# Patient Record
Sex: Female | Born: 1965 | Race: White | Hispanic: No | Marital: Married | Smoking: Current every day smoker
Health system: Southern US, Community
[De-identification: ages and names within clinical notes are randomized; demographics above are authoritative.]

## PROBLEM LIST (undated history)

## (undated) DIAGNOSIS — E039 Hypothyroidism, unspecified: Secondary | ICD-10-CM

## (undated) DIAGNOSIS — R001 Bradycardia, unspecified: Secondary | ICD-10-CM

## (undated) DIAGNOSIS — H518 Other specified disorders of binocular movement: Secondary | ICD-10-CM

## (undated) DIAGNOSIS — D219 Benign neoplasm of connective and other soft tissue, unspecified: Secondary | ICD-10-CM

## (undated) DIAGNOSIS — Z7982 Long term (current) use of aspirin: Secondary | ICD-10-CM

## (undated) DIAGNOSIS — R002 Palpitations: Secondary | ICD-10-CM

## (undated) DIAGNOSIS — E232 Diabetes insipidus: Secondary | ICD-10-CM

## (undated) DIAGNOSIS — I700 Atherosclerosis of aorta: Secondary | ICD-10-CM

## (undated) DIAGNOSIS — I209 Angina pectoris, unspecified: Secondary | ICD-10-CM

## (undated) DIAGNOSIS — R0602 Shortness of breath: Secondary | ICD-10-CM

## (undated) DIAGNOSIS — M1990 Unspecified osteoarthritis, unspecified site: Secondary | ICD-10-CM

## (undated) DIAGNOSIS — F419 Anxiety disorder, unspecified: Secondary | ICD-10-CM

## (undated) DIAGNOSIS — D352 Benign neoplasm of pituitary gland: Secondary | ICD-10-CM

## (undated) DIAGNOSIS — R000 Tachycardia, unspecified: Secondary | ICD-10-CM

## (undated) DIAGNOSIS — R42 Dizziness and giddiness: Secondary | ICD-10-CM

## (undated) DIAGNOSIS — E785 Hyperlipidemia, unspecified: Secondary | ICD-10-CM

## (undated) DIAGNOSIS — H509 Unspecified strabismus: Secondary | ICD-10-CM

## (undated) DIAGNOSIS — I2510 Atherosclerotic heart disease of native coronary artery without angina pectoris: Secondary | ICD-10-CM

## (undated) DIAGNOSIS — E2749 Other adrenocortical insufficiency: Secondary | ICD-10-CM

## (undated) DIAGNOSIS — E237 Disorder of pituitary gland, unspecified: Secondary | ICD-10-CM

## (undated) HISTORY — PX: EYE SURGERY: SHX253

## (undated) HISTORY — PX: COLONOSCOPY: SHX174

## (undated) HISTORY — PX: PITUITARY EXCISION: SHX745

## (undated) HISTORY — PX: ANKLE FRACTURE SURGERY: SHX122

## (undated) HISTORY — DX: Anxiety disorder, unspecified: F41.9

---

## 2008-06-27 ENCOUNTER — Ambulatory Visit: Payer: Self-pay

## 2008-12-17 ENCOUNTER — Ambulatory Visit: Payer: Self-pay

## 2008-12-27 ENCOUNTER — Other Ambulatory Visit: Payer: Self-pay | Admitting: Physician Assistant

## 2009-01-03 ENCOUNTER — Ambulatory Visit: Payer: Self-pay

## 2009-01-14 ENCOUNTER — Ambulatory Visit: Payer: Self-pay | Admitting: Orthopedic Surgery

## 2009-10-09 ENCOUNTER — Ambulatory Visit: Payer: Self-pay | Admitting: Obstetrics & Gynecology

## 2009-10-15 ENCOUNTER — Ambulatory Visit: Payer: Self-pay | Admitting: Obstetrics & Gynecology

## 2010-01-05 HISTORY — PX: PARTIAL HYSTERECTOMY: SHX80

## 2012-01-29 ENCOUNTER — Inpatient Hospital Stay: Payer: Self-pay | Admitting: Orthopedic Surgery

## 2012-01-29 DIAGNOSIS — I10 Essential (primary) hypertension: Secondary | ICD-10-CM

## 2012-01-29 LAB — URINALYSIS, COMPLETE
Bacteria: NONE SEEN
Granular Cast: 3
Ketone: NEGATIVE
Leukocyte Esterase: NEGATIVE
Nitrite: NEGATIVE
RBC,UR: <1 /HPF (ref 0–5)
Specific Gravity: 1.016 (ref 1.003–1.030)
Squamous Epithelial: 1

## 2012-01-29 LAB — CBC WITH DIFFERENTIAL/PLATELET
Basophil %: 0.6 %
HCT: 39.5 % (ref 35.0–47.0)
HGB: 13.4 g/dL (ref 12.0–16.0)
Lymphocyte #: 2.7 10*3/uL (ref 1.0–3.6)
Lymphocyte %: 23.5 %
MCH: 30 pg (ref 26.0–34.0)
MCHC: 34 g/dL (ref 32.0–36.0)
MCV: 88 fL (ref 80–100)
Neutrophil %: 69.1 %
Platelet: 204 10*3/uL (ref 150–440)
RBC: 4.47 10*6/uL (ref 3.80–5.20)
RDW: 13.5 % (ref 11.5–14.5)
WBC: 11.6 10*3/uL — ABNORMAL HIGH (ref 3.6–11.0)

## 2012-01-29 LAB — BASIC METABOLIC PANEL
Anion Gap: 8 (ref 7–16)
BUN: 8 mg/dL (ref 7–18)
Chloride: 107 mmol/L (ref 98–107)
Co2: 21 mmol/L (ref 21–32)
EGFR (African American): >60
Glucose: 88 mg/dL (ref 65–99)
Osmolality: 270 (ref 275–301)

## 2012-01-29 LAB — PROTIME-INR
INR: 1
Prothrombin Time: 13.1 secs (ref 11.5–14.7)

## 2013-12-14 IMAGING — CR DG CHEST 2V
1 series · 2 of 2 positions shown · non-contrast
Comparison: none

REASON FOR EXAM: hypertension
COMMENTS:

PROCEDURE:     DXR - DXR CHEST PA (OR AP) AND LATERAL  - January 29, 2012 [DATE]
RESULT:     The lungs are clear. The cardiac silhouette and visualized bony
skeleton are unremarkable.

[Series 1: ap · 0.17mm/px · 2 of 2 slices shown]
[im 1/2]
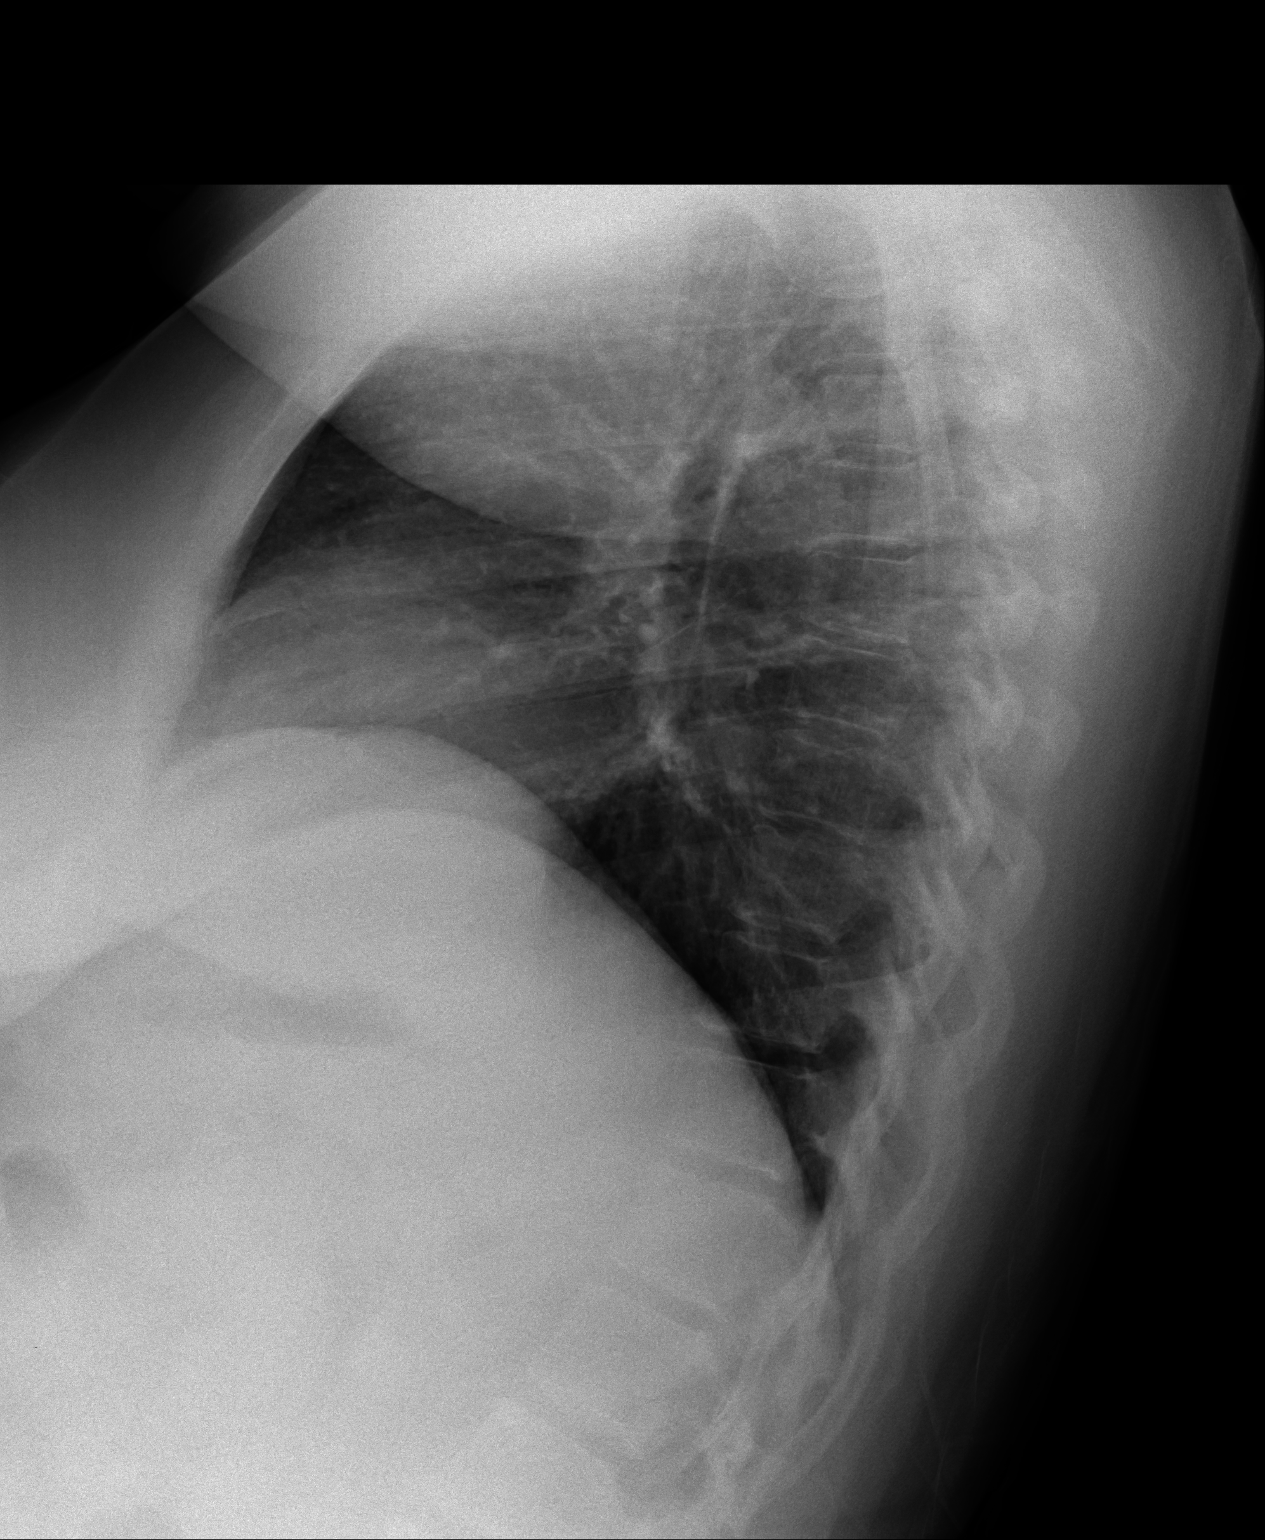
[im 2/2]
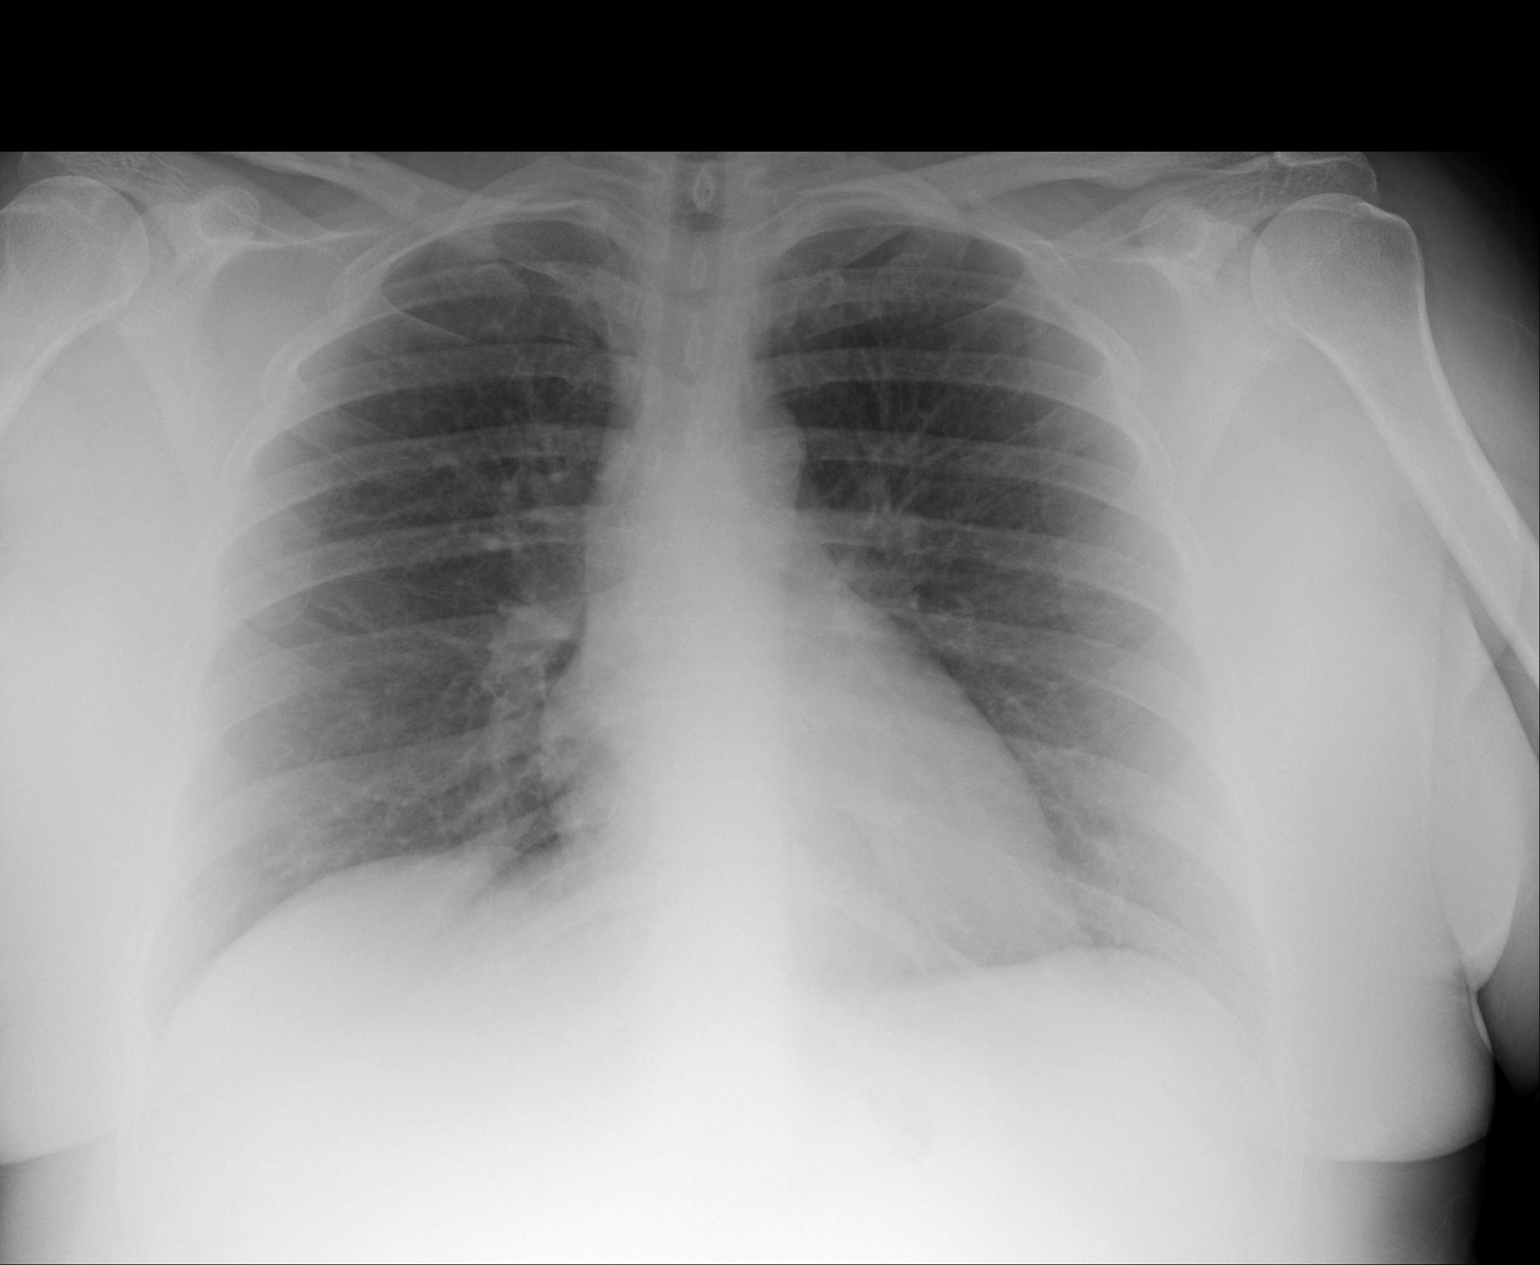

[2 of 2 positions shown; findings below may reference images not displayed]

IMPRESSION: 1. Chest radiograph without evidence of acute cardiopulmonary disease.

## 2013-12-14 IMAGING — CR RIGHT ANKLE - 2 VIEW
1 series · 2 of 2 positions shown · non-contrast
Comparison: none

REASON FOR EXAM: fall, fracture dislocation
COMMENTS:

PROCEDURE:     DXR - DXR ANKLE RIGHT AP AND LATERAL  - January 29, 2012  [DATE]
RESULT:
There has been interval reduction of the right ankle fracture. There is a
component of ankle mortise disruption medially. Overlying cast material
partially obscures evaluation.

[Series 1: ap · 0.17mm/px · 2 of 2 slices shown]
[im 1/2]
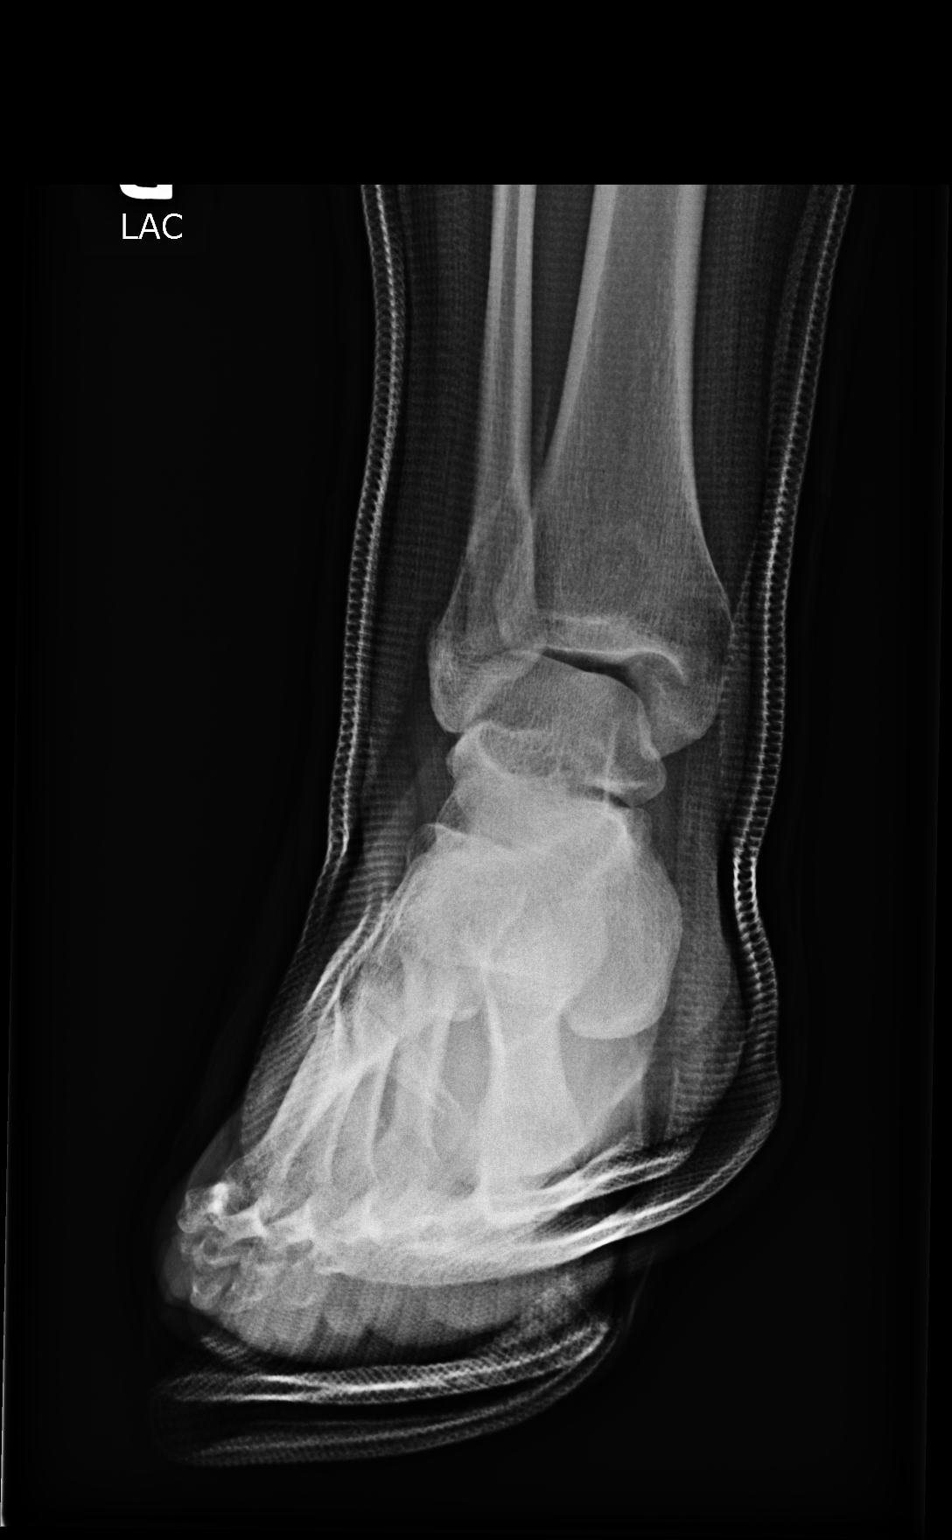
[im 2/2]
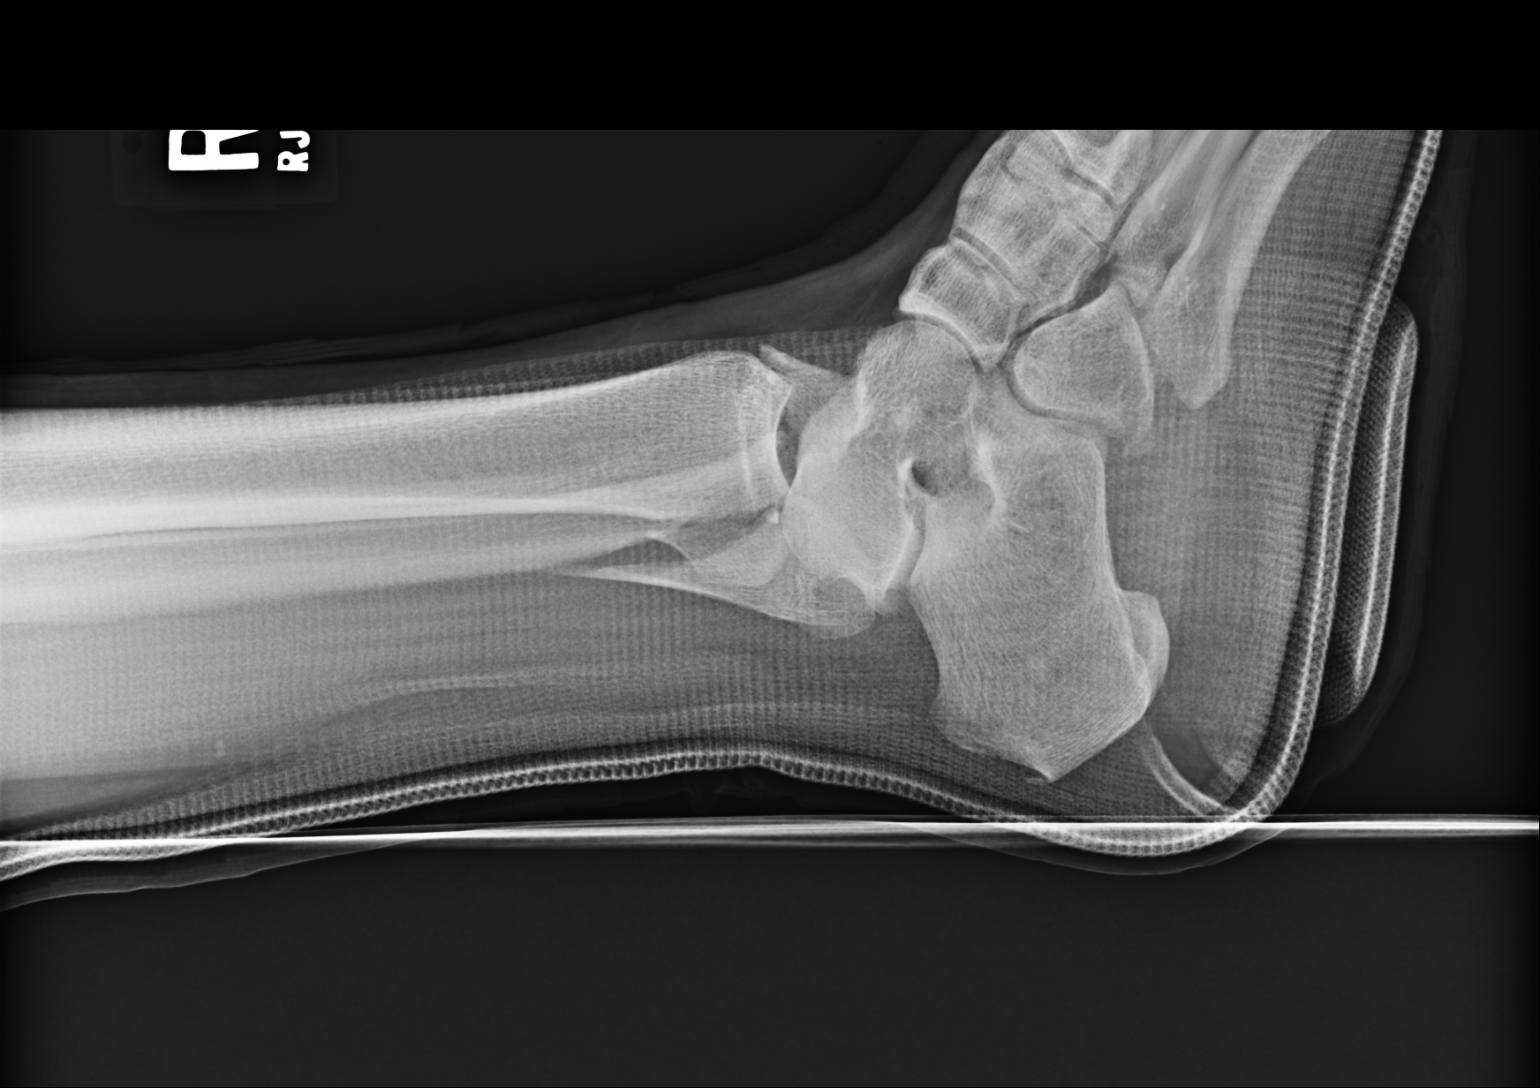

[2 of 2 positions shown; findings below may reference images not displayed]

IMPRESSION: Bimalleolar fracture involving the distal fibula and medial
malleolus.

## 2014-04-27 NOTE — H&P (Signed)
PATIENT NAME:  Frances Mckay, Frances Mckay MR#:  [id number] DATE OF BIRTH:  Aug 07, 1965  DATE OF ADMISSION:  01/29/2012  CHIEF COMPLAINT: Right ankle pain.   HISTORY OF PRESENT ILLNESS: The patient is a 49 year old who was stepping out of her home to go to work this morning when she slipped and rolled her ankle. She had immediate pain and deformity and was brought to the Emergency Room where she was splinted and x-rays obtained showing a trimalleolar ankle fracture-dislocation. She is being admitted for treatment of this. 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  PAST MEDICAL HISTORY: Hysterectomy.  CURRENT MEDICATIONS: Zoloft daily.  DRUG ALLERGIES: No known drug allergies.  SOCIAL HISTORY: She smokes about a third of a pack per day and works at [employer] in [employer].   REVIEW OF SYSTEMS: Positive only for right ankle pain. She denies any other injury.   PHYSICAL EXAMINATION:  GENERAL: Moderately obese white female who appears her stated age, in mild distress.  HEENT: Unremarkable.  LUNGS: Clear.  HEART: Regular rate and rhythm.  ABDOMEN: Soft and nontender.  EXTREMITIES: Remarkable for the right leg. She has intact sensation to her toes. She has a palpable dorsalis pedis pulse through the splint. She has brisk capillary refill and intact sensation.   X-RAYS: Trimalleolar fracture, posterior malleolar fragment that appears relatively small, large distal fibular spiral fracture and medial malleolus is also displaced.   CLINICAL IMPRESSION: Right trimalleolar ankle fracture-dislocation.   PLAN: I discussed treatment options as this is normally a fracture that requires repair. The risks, benefits and possible complications were discussed. We would like to proceed with surgery today before all the expected swelling occurs. She understands this and we will plan on surgery  later today since she has not eaten. Expect 1 to 2 nights in the hospital until she is safe with walker or crutches as well as giving her perioperative antibiotic. ____________________________ Laurene Footman, MD mjm:sb D: 01/29/2012 11:12:17 ET T: 01/29/2012 11:26:27 ET JOB#: [id number]  cc: Laurene Footman, MD, <Dictator> Laurene Footman MD ELECTRONICALLY SIGNED 01/29/2012 12:22

## 2014-04-27 NOTE — Op Note (Signed)
PATIENT NAME:  Frances Mckay, Frances Mckay MR#:  [id number] DATE OF BIRTH:  06/20/1965  DATE OF PROCEDURE:  01/29/2012  PREOPERATIVE DIAGNOSIS: Trimalleolar ankle fracture-dislocation, right ankle.   POSTOPERATIVE DIAGNOSIS: Trimalleolar ankle fracture-dislocation, right ankle.  PROCEDURE PERFORMED: Open reduction/internal fixation right trimalleolar ankle fracture-dislocation.   SURGEON: Laurene Footman, M.D.   ANESTHESIA: General.   COMPLICATIONS:  None.  SPECIMENS:  None.  ESTIMATED BLOOD LOSS:  Minimal.  IMPLANTS:  Biomet composite fibular plate, 6-hole, with multiple screws, two 35 mm malleolar screws medially, 4.0 malleolar screws.  DESCRIPTION OF PROCEDURE: The patient was brought to the Operating Room and after general anesthesia was obtained, the right leg was prepped and draped in the usual sterile fashion with a tourniquet applied to the upper thigh and a bump underneath the right buttock. After patient identification and timeout procedures were completed, the leg was exsanguinated with an Esmarch and the tourniquet raised to 300 mmHg.  A lateral incision was made, first to the midline of the fibula. Subcutaneous tissue was spread and the fracture site exposed. With a reduction clamp applied, anatomic reduction could be obtained. A six hole plate from the Biomet fibular plating system was obtained and contoured to fit the distal fibula.  In the six hole composite plate, the two proximal cortical screws were inserted and the locking screws were then filled sequentially with a K-wire to initially hold the plate in the appropriate position. The most distal screw hole was bent around so it would go up into the lateral malleolus and there was no penetration into the joint after all six screws had been placed.  The reduction clamp was removed and the fracture appeared quite stable. Lateral view showed adequate reduction of the posterior malleolar fragment with this indirect means.  Going to the medial  aspect of the ankle there abrasions anteromedially and so more of a posterior medial approach was made. The fracture site was exposed and of the fracture reduced. A K-wire was used to hold it in place.  A drill was then used to place a drill hole for a 35 mm malleolar screw.  The K-wire was removed and a second 35 mm screw was then inserted with good compression at the fracture site with essentially anatomic alignment on mortise view.  To palpation the fracture appeared adequately reduced, as well. The wounds were then thoroughly irrigated and the wounds closed with 2-0 Vicryl subcutaneously and skin staples. Xeroform, 4 x 4's, Webril, ABD and stirrup splint were applied followed by an Ace wrap. Tourniquet time: 45 minutes at 300 mmHg.   ____________________________ Laurene Footman, MD mjm:eg D: 01/29/2012 16:13:42 ET T: 01/30/2012 00:11:47 ET JOB#: [id number]  cc: Laurene Footman, MD, <Dictator> Laurene Footman MD ELECTRONICALLY SIGNED 01/30/2012 10:12

## 2014-04-27 NOTE — Discharge Summary (Signed)
PATIENT NAME:  Frances Mckay, Frances Mckay MR#:  [id number] DATE OF BIRTH:  1965-06-30  DATE OF ADMISSION:  01/29/2012 DATE OF DISCHARGE:    ADMITTING DIAGNOSIS: Right ankle trimalleolar fracture-dislocation.   DISCHARGE DIAGNOSIS: Right ankle trimalleolar fracture-dislocation.   SURGEON: Laurene Footman, M.D..   PROCEDURE PERFORMED: ORIF right trimalleolar ankle fracture-dislocation.   ANESTHESIA: General.   COMPLICATIONS: None.   SPECIMEN: None.   ESTIMATED BLOOD LOSS: Minimal.   IMPLANTS: Biomet composite fibular plate, 6-hole with multiple screws, two 35 mm malleolus screws medially and, 4.0 malleolar screws.   HISTORY: A 49 year old female, who stepping out of her home to go to work this morning when she slipped and rolled her ankle. She had immediate pain and deformity and was brought to the ER where she was splinted and x-rays obtained showing an ankle fracture-dislocation. She is being admitted for treatment of this. 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 No problems walking and had no loss of consciousness, just stumbled as she left her home.   PHYSICAL EXAMINATION:  GENERAL: A moderately obese white female, who appears her stated age in mild distress.  HEENT: Unremarkable.  LUNGS: Clear.  HEART: Regular rate and rhythm.  ABDOMEN: Soft, nontender.  EXTREMITIES: Remarkable for right leg. She has intact sensation to her toes. She has a palpable dorsalis pedis pulse through the splint. She has brisk capillary refill and intact sensation.   HOSPITAL COURSE: The patient was admitted to the hospital on 01/29/2012. She had surgery that same day and was brought to the orthopedic floor from the PACU in stable condition. On postoperative day 1, the patient had a moderate amount of pain. Her medications were changed to nucynta as well as methocarbamol. By postoperative 2, pain had significantly improved and she had progressed well enough in physical therapy to go home. On  postoperative day 2, dressing was changed. The incision looked well. A cast was placed. She was ready for discharge home in stable condition.   DISCHARGE INSTRUCTIONS: Elevate the affected foot and leg on 1 or 2 pillows with the foot higher than the knee. She is toe-touch weight-bearing. She may resume a regular diet as tolerated. She is to apply an ice pack to the affected area. Do not get the dressing or bandage wet or dirty. She needs to call [facility] ortho if the dressing gets water under it. Leave the dressing on. Do not place any objects under the cast and is to call [facility] ortho if any following occur: Bright red bleeding from the incision or wound or fever above 101.5 degrees, redness, swelling, or drainage at the incision. She needs to followup with [facility] orthopedics in 2 weeks.   DISCHARGE MEDICATIONS: Sertraline 100 mg oral tablet 1 tablet orally once a day in the morning and 0.5 mg tablet at bedtime, Nucynta 75 mg oral tablet 1 tablet every 4 to 6 hours as needed for pain, aspirin 325 delayed-release 1 tablet orally once a day, Bisacodyl 10 mg rectal suppository 1 suppository rectally once a day as needed for constipation, Senokot 1 tablet orally 2 times a day, methocarbamol 750 mg oral tablet 1 tablet 4 times a day as needed for muscle spasms.   ____________________________ Duanne Guess, PA-C tcg:aw D: 01/31/2012 12:00:14 ET T: 01/31/2012 12:43:09 ET JOB#: [id number]  cc: Duanne Guess, PA-C, <Dictator> Duanne Guess [place] ELECTRONICALLY SIGNED 02/09/2012 10:38

## 2014-09-18 ENCOUNTER — Other Ambulatory Visit: Payer: Self-pay | Admitting: Otolaryngology

## 2014-09-18 DIAGNOSIS — H905 Unspecified sensorineural hearing loss: Secondary | ICD-10-CM

## 2014-09-27 ENCOUNTER — Ambulatory Visit
Admission: RE | Admit: 2014-09-27 | Discharge: 2014-09-27 | Disposition: A | Discharge status: Home / Self Care | Payer: BC Managed Care – PPO | Source: Ambulatory Visit | Attending: Otolaryngology | Admitting: Otolaryngology

## 2014-09-27 DIAGNOSIS — H9042 Sensorineural hearing loss, unilateral, left ear, with unrestricted hearing on the contralateral side: Secondary | ICD-10-CM | POA: Diagnosis not present

## 2014-09-27 DIAGNOSIS — H905 Unspecified sensorineural hearing loss: Secondary | ICD-10-CM

## 2014-09-27 MED ORDER — GADOBENATE DIMEGLUMINE 529 MG/ML IV SOLN
15.0000 mL | Freq: Once | INTRAVENOUS | Status: AC | PRN
  Administered 2014-09-27: 15 mL via INTRAVENOUS

## 2014-10-08 DIAGNOSIS — E236 Other disorders of pituitary gland: Secondary | ICD-10-CM | POA: Insufficient documentation

## 2014-10-23 DIAGNOSIS — E221 Hyperprolactinemia: Secondary | ICD-10-CM | POA: Insufficient documentation

## 2014-12-06 DIAGNOSIS — E237 Disorder of pituitary gland, unspecified: Secondary | ICD-10-CM | POA: Insufficient documentation

## 2014-12-18 HISTORY — PX: OTHER SURGICAL HISTORY: SHX169

## 2015-01-09 DIAGNOSIS — E232 Diabetes insipidus: Secondary | ICD-10-CM | POA: Insufficient documentation

## 2015-01-09 DIAGNOSIS — E2749 Other adrenocortical insufficiency: Secondary | ICD-10-CM | POA: Insufficient documentation

## 2015-01-09 DIAGNOSIS — E038 Other specified hypothyroidism: Secondary | ICD-10-CM | POA: Insufficient documentation

## 2015-01-23 DIAGNOSIS — R519 Headache, unspecified: Secondary | ICD-10-CM | POA: Insufficient documentation

## 2015-01-23 DIAGNOSIS — R51 Headache: Secondary | ICD-10-CM

## 2015-01-30 DIAGNOSIS — D219 Benign neoplasm of connective and other soft tissue, unspecified: Secondary | ICD-10-CM | POA: Insufficient documentation

## 2015-03-20 LAB — HM PAP SMEAR: HM Pap smear: NEGATIVE

## 2015-03-20 LAB — HM MAMMOGRAPHY: HM Mammogram: NORMAL (ref 0–4)

## 2016-06-11 DIAGNOSIS — H52203 Unspecified astigmatism, bilateral: Secondary | ICD-10-CM

## 2016-06-11 DIAGNOSIS — H5213 Myopia, bilateral: Secondary | ICD-10-CM | POA: Insufficient documentation

## 2016-06-11 DIAGNOSIS — Z9889 Other specified postprocedural states: Secondary | ICD-10-CM | POA: Insufficient documentation

## 2016-06-11 DIAGNOSIS — H5005 Alternating esotropia: Secondary | ICD-10-CM | POA: Insufficient documentation

## 2016-06-11 DIAGNOSIS — H518 Other specified disorders of binocular movement: Secondary | ICD-10-CM | POA: Insufficient documentation

## 2016-09-22 ENCOUNTER — Encounter: Payer: Self-pay | Admitting: Obstetrics and Gynecology

## 2016-09-23 ENCOUNTER — Encounter: Payer: Self-pay | Admitting: Obstetrics and Gynecology

## 2016-09-23 ENCOUNTER — Ambulatory Visit (INDEPENDENT_AMBULATORY_CARE_PROVIDER_SITE_OTHER): Payer: BC Managed Care – PPO | Admitting: Obstetrics and Gynecology

## 2016-09-23 VITALS — BP 108/74 | HR 75 | Ht 64.0 in | Wt 207.0 lb

## 2016-09-23 DIAGNOSIS — Z1211 Encounter for screening for malignant neoplasm of colon: Secondary | ICD-10-CM | POA: Diagnosis not present

## 2016-09-23 DIAGNOSIS — Z1231 Encounter for screening mammogram for malignant neoplasm of breast: Secondary | ICD-10-CM

## 2016-09-23 DIAGNOSIS — F411 Generalized anxiety disorder: Secondary | ICD-10-CM

## 2016-09-23 DIAGNOSIS — Z01419 Encounter for gynecological examination (general) (routine) without abnormal findings: Secondary | ICD-10-CM

## 2016-09-23 DIAGNOSIS — Z1239 Encounter for other screening for malignant neoplasm of breast: Secondary | ICD-10-CM

## 2016-09-23 MED ORDER — ESCITALOPRAM OXALATE 10 MG PO TABS: 10.0000 mg | ORAL_TABLET | Freq: Every day | ORAL | 2 refills | Status: DC

## 2016-09-23 MED ORDER — HYDROXYZINE HCL 25 MG PO TABS: 25.0000 mg | ORAL_TABLET | Freq: Four times a day (QID) | ORAL | 2 refills | Status: DC | PRN

## 2016-09-23 NOTE — Progress Notes (Signed)
Gynecology Annual Exam  PCP: Sofie Hartigan, MD  Chief Complaint:  Chief Complaint  Patient presents with  . Gynecologic Exam    Anxiety/can not sleep    History of Present Illness: Patient is a 51 y.o. G1P1001 presents for annual exam. The patient has no complaints today.   LMP: No LMP recorded. Patient has had a hysterectomy. Supracervical by Dr. Kenton Kingfisher 10/15/2009  The patient is sexually active. She is not using anything for contraception secondary to prior hysterectomy. She denies dyspareunia.  The patient does perform self breast exams.  There is no notable family history of breast or ovarian cancer in her family.  The patient wears seatbelts: yes.   The patient has regular exercise: not asked.    The patient denies current symptoms of depression.    The patient is a 51 y.o. female presenting initial evaluation for symptoms of anxiety.  The patient is currently taking nothing for the management of her symptoms.  She has had any recent situational stressors, unsure status of her beach hourse, daughter graduating college and her daughters husband currently being deployed in [place].  She reports symptoms of insomnia, increased appetite and social anxiety.  She denies anhedonia, risk taking behavior, increased appetite, decreased appetite, agorophobia, feelings of guilt, feelings of worthlessness, suicidal ideation, homicidal ideation, auditory hallucinations and visual hallucinations. Symptoms have remained unchanged since initial onset.     The patient does have a pre-existing history of depression and anxiety.  She  does not a prior history of suicide attempts.  Previous treatment tied include was previously on paxil.  Review of Systems: Review of Systems  Constitutional: Negative for chills and fever.  HENT: Negative for congestion.   Respiratory: Negative for cough and shortness of breath.   Cardiovascular: Negative for chest pain and palpitations.  Gastrointestinal:  Negative for abdominal pain, constipation, diarrhea, heartburn, nausea and vomiting.  Genitourinary: Negative for dysuria, frequency and urgency.  Skin: Negative for itching and rash.  Neurological: Negative for dizziness and headaches.  Endo/Heme/Allergies: Negative for polydipsia.  Psychiatric/Behavioral: Negative for depression.    Past Medical History:  Past Medical History:  Diagnosis Date  . Anxiety     Past Surgical History:  Past Surgical History:  Procedure Laterality Date  . CESAREAN SECTION    . PARTIAL HYSTERECTOMY  2012   [facility]    Gynecologic History:  No LMP recorded. Patient has had a hysterectomy. Contraception: status post hysterectomy Last Pap: Results were: 03/20/15 NIL Last mammogram: 03/21/15 Results were: BI-RAD I  Obstetric History: G1P1001  Family History:  Family History  Problem Relation Age of Onset  . Hypertension Mother   . Diabetes Father   . Hypertension Father   . Hypertension Sister   . Diabetes Maternal Grandmother   . Hypertension Maternal Grandmother   . Diabetes Paternal Grandmother   . Hypertension Paternal Grandmother   . Lung cancer Paternal Grandfather   . Skin cancer Maternal Grandfather     Social History:  Social History   Social History  . Marital status: Married    Spouse name: N/A  . Number of children: N/A  . Years of education: N/A   Occupational History  . Not on file.   Social History Main Topics  . Smoking status: Never Smoker  . Smokeless tobacco: Never Used  . Alcohol use No  . Drug use: No  . Sexual activity: Yes    Partners: Male    Birth control/ protection: None   Other  Topics Concern  . Not on file   Social History Narrative  . No narrative on file    Allergies:  No Known Allergies  Medications: Prior to Admission medications   Not on File    Physical Exam Blood pressure 108/74, pulse 75, height 5\' 4"  (1.626 m), weight 207 lb (93.9 kg).  General: NAD HEENT: normocephalic,  anicteric Thyroid: no enlargement, no palpable nodules Pulmonary: No increased work of breathing, CTAB Cardiovascular: RRR, distal pulses 2+ Breast: Breast symmetrical, no tenderness, no palpable nodules or masses, no skin or nipple retraction present, no nipple discharge.  No axillary or supraclavicular lymphadenopathy. Abdomen: NABS, soft, non-tender, non-distended.  Umbilicus without lesions.  No hepatomegaly, splenomegaly or masses palpable. No evidence of hernia  Genitourinary:  External: Normal external female genitalia.  Normal urethral meatus, normal  Bartholin's and Skene's glands.    Vagina: Normal vaginal mucosa, no evidence of prolapse.    Cervix: Grossly normal in appearance, no bleeding  Uterus: Surgically absent  Adnexa: ovaries non-enlarged, no adnexal masses  Rectal: deferred  Lymphatic: no evidence of inguinal lymphadenopathy Extremities: no edema, erythema, or tenderness Neurologic: Grossly intact Psychiatric: mood appropriate, affect full  Female chaperone present for pelvic and breast  portions of the physical exam    Assessment: 51 y.o. G1P1001 routine annual exam  Plan: Problem List Items Addressed This Visit    None    Visit Diagnoses    Breast screening       Relevant Orders   MM DIGITAL SCREENING BILATERAL   Special screening for malignant neoplasms, colon       Relevant Orders   Ambulatory referral to Gastroenterology   Encounter for gynecological examination without abnormal finding         Annual   1) Mammogram - recommend yearly screening mammogram.  Mammogram Was ordered today   2) STI screening was not offered  3) ASCCP guidelines and rational discussed.  Patient opts for every 3 years screening interval - next 2020  4) Contraception - N/A  5) Colonoscopy -- Screening recommended starting at age 84 for average risk individuals, age 69 for individuals deemed at increased risk (including African Americans) and recommended to continue  until age 33.  For patient age 89-85 individualized approach is recommended.  Gold standard screening is via colonoscopy, Cologuard screening is an acceptable alternative for patient unwilling or unable to undergo colonoscopy.  "Colorectal cancer screening for average?risk adults: 2018 guideline update from the American Cancer Society"CA: A Cancer Journal for Clinicians: Jun 03, 2016  - colonoscopy referral made   6) Routine healthcare maintenance including cholesterol, diabetes screening discussed managed by PCP   7) Folloq up 1 year for routine annual exam  Generalized Anxiety 1) GAD-7 today is 18, denies significant depression symptoms.  Has had some stressors but feels situational stressors aren't the cause and symptoms have been present previously  No SI/HI. - Start lexapro 10mg  tab po daily - Vistaril prn  - If otherwise good improvement in symptoms but continued insomnia consider adding trazodone at next visit  - Follow up and rescale in 6 weeks

## 2016-10-26 ENCOUNTER — Telehealth: Payer: Self-pay | Admitting: Gastroenterology

## 2016-10-26 NOTE — Telephone Encounter (Signed)
Patient is returning a call to schedule a procedure

## 2016-10-27 DIAGNOSIS — D352 Benign neoplasm of pituitary gland: Secondary | ICD-10-CM | POA: Insufficient documentation

## 2016-10-30 ENCOUNTER — Ambulatory Visit (INDEPENDENT_AMBULATORY_CARE_PROVIDER_SITE_OTHER): Payer: BC Managed Care – PPO | Admitting: Obstetrics and Gynecology

## 2016-10-30 ENCOUNTER — Encounter: Payer: Self-pay | Admitting: Obstetrics and Gynecology

## 2016-10-30 VITALS — BP 130/82 | HR 73 | Ht 64.0 in | Wt 207.0 lb

## 2016-10-30 DIAGNOSIS — F411 Generalized anxiety disorder: Secondary | ICD-10-CM | POA: Diagnosis not present

## 2016-10-30 MED ORDER — TRAZODONE HCL 50 MG PO TABS: 50.0000 mg | ORAL_TABLET | Freq: Every evening | ORAL | 6 refills | Status: DC | PRN

## 2016-10-30 MED ORDER — ESCITALOPRAM OXALATE 10 MG PO TABS: 10.0000 mg | ORAL_TABLET | Freq: Every day | ORAL | 11 refills | Status: DC

## 2016-10-30 MED ORDER — HYDROXYZINE HCL 25 MG PO TABS: 25.0000 mg | ORAL_TABLET | Freq: Four times a day (QID) | ORAL | 2 refills | Status: DC | PRN

## 2016-10-30 NOTE — Progress Notes (Signed)
Obstetrics & Gynecology Office Visit   Chief Complaint:  Chief Complaint  Patient presents with  . Follow-up    Medication    History of Present Illness: The patient is a 51 y.o. female presenting follow up for symptoms of anxiety.  The patient is currently taking Lexapro 10mg  tab po daily and prn vistaril for the management of her symptoms.  She has not had any recent situational stressors.  She reports symptoms of still having some problems falling asleep but no problems staying asleep.  She denies anhedonia, day time somnolence, insomnia, risk taking behavior, irritability, increased appetite, decreased appetite, social anxiety, agorophobia, feelings of guilt, feelings of worthlessness, suicidal ideation, homicidal ideation, auditory hallucinations and visual hallucinations. Symptoms have improved since last visit.     The patient does have a pre-existing history of depression and anxiety.  She  does not a prior history of suicide attempts.    Review of Systems:Review of Systems  Neurological: Negative for dizziness and headaches.  Psychiatric/Behavioral: Negative for depression, hallucinations, memory loss, substance abuse and suicidal ideas. The patient has insomnia. The patient is not nervous/anxious.     Past Medical History:  Past Medical History:  Diagnosis Date  . Anxiety     Past Surgical History:  Past Surgical History:  Procedure Laterality Date  . CESAREAN SECTION    . PARTIAL HYSTERECTOMY  2012   [facility]    Gynecologic History: No LMP recorded. Patient has had a hysterectomy.  Obstetric History: G1P1001  Family History:  Family History  Problem Relation Age of Onset  . Hypertension Mother   . Diabetes Father   . Hypertension Father   . Hypertension Sister   . Diabetes Maternal Grandmother   . Hypertension Maternal Grandmother   . Diabetes Paternal Grandmother   . Hypertension Paternal Grandmother   . Lung cancer Paternal Grandfather   . Skin  cancer Maternal Grandfather     Social History:  Social History   Social History  . Marital status: Married    Spouse name: N/A  . Number of children: N/A  . Years of education: N/A   Occupational History  . Not on file.   Social History Main Topics  . Smoking status: Never Smoker  . Smokeless tobacco: Never Used  . Alcohol use No  . Drug use: No  . Sexual activity: Yes    Partners: Male    Birth control/ protection: None   Other Topics Concern  . Not on file   Social History Narrative  . No narrative on file    Allergies:  No Known Allergies  Medications: Prior to Admission medications   Medication Sig Start Date End Date Taking? Authorizing Provider  Acetaminophen (MAPAP) 500 MG coapsule Take by mouth.   Yes [provider]  escitalopram (LEXAPRO) 10 MG tablet Take 1 tablet (10 mg total) by mouth daily. 10/30/16 10/30/17 Yes Malachy Mood, MD  hydrOXYzine (ATARAX/VISTARIL) 25 MG tablet Take 1 tablet (25 mg total) by mouth every 6 (six) hours as needed for itching. 10/30/16  Yes Malachy Mood, MD  Multiple Vitamin (MULTI-VITAMINS) TABS Take by mouth.   Yes [provider]  traZODone (DESYREL) 50 MG tablet Take 1 tablet (50 mg total) by mouth at bedtime as needed for sleep. 10/30/16   Malachy Mood, MD    Physical Exam Vitals:  Vitals:   10/30/16 0826  BP: 130/82  Pulse: 73   No LMP recorded. Patient has had a hysterectomy.  General: NAD  HEENT: normocephalic, anicteric Pulmonary: No increased work of breathing Neurologic: Grossly intact Psychiatric: mood appropriate, affect full, good insight  Female chaperone present for pelvic and breast  portions of the physical exam  Assessment: 51 y.o. G1P1001 follow up for generalized anxiety  Plan: Problem List Items Addressed This Visit    None    Visit Diagnoses    Generalized anxiety disorder    -  Primary   Relevant Medications   hydrOXYzine (ATARAX/VISTARIL) 25 MG tablet     traZODone (DESYREL) 50 MG tablet   escitalopram (LEXAPRO) 10 MG tablet     - GAD-7 7 down from 18, with significant improvement in symptoms PHQ-9 4  - Add trazodone prn for sleep - Keep lexapro at 10mg  daily patient is happy with the current control of symptoms.  No side-effects since starting.  We discussed if worsening or relapse of symptoms we do have the ability to increase the dose of lexparo as well as adding buspar. - A total of 15 minutes were spent in face-to-face contact with the patient during this encounter with over half of that time devoted to counseling and coordination of care.

## 2016-10-30 NOTE — Telephone Encounter (Signed)
LVM for pt to return my call.

## 2016-11-04 ENCOUNTER — Telehealth: Payer: Self-pay

## 2016-11-04 NOTE — Telephone Encounter (Signed)
Returned call to schedule colonoscopy.  She said she discussed with her PCP and will wait until she turns 52 to have colonoscopy.  Thanks [employer]

## 2016-12-04 ENCOUNTER — Ambulatory Visit
Admission: EM | Admit: 2016-12-04 | Discharge: 2016-12-04 | Disposition: A | Discharge status: Home / Self Care | Payer: BC Managed Care – PPO | Attending: Family Medicine | Admitting: Family Medicine

## 2016-12-04 DIAGNOSIS — M25551 Pain in right hip: Secondary | ICD-10-CM | POA: Diagnosis not present

## 2016-12-04 DIAGNOSIS — S139XXA Sprain of joints and ligaments of unspecified parts of neck, initial encounter: Secondary | ICD-10-CM

## 2016-12-04 DIAGNOSIS — M5440 Lumbago with sciatica, unspecified side: Secondary | ICD-10-CM | POA: Diagnosis not present

## 2016-12-04 MED ORDER — NAPROXEN 500 MG PO TABS: 500.0000 mg | ORAL_TABLET | Freq: Two times a day (BID) | ORAL | 0 refills | Status: DC

## 2016-12-04 NOTE — ED Provider Notes (Signed)
[facility]    CSN: [id number] Arrival date & time: 12/04/16  0847     History   Chief Complaint Chief Complaint  Patient presents with  . Hip Pain    HPI Frances Mckay is a 51 y.o. female.   HPI   This is a 51 year old female who presents with pain in her neck and lower back and posterior hip area she states that on Wednesday 2 days prior to this visit she had a partial slip when she on wet floor in her kitchen.  She did not completely fall but after the slip noticed that she had her neck stiff neck low back pain and the right hip pain.  She has had the right hip pain for some time this was exacerbated when she slipped.  She has no radicular component in her lower or upper extremities.  She states that it has been somewhat better with rest.  She is taking ibuprofen 600 mg twice a day which seems to help somewhat.        Past Medical History:  Diagnosis Date  . Anxiety     Patient Active Problem List   Diagnosis Date Noted  . Pituitary microadenoma ([place]) 10/27/2016  . 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 History of strabismus surgery 06/11/2016  . Myopia of both eyes with astigmatism 06/11/2016  . Benign neoplasm of connective and other soft tissue, unspecified 01/30/2015  . Granular cell tumor 01/30/2015  . Headache 01/23/2015  . 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 Hypothyroidism, secondary 01/09/2015  . 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 Other specified hypothyroidism 01/09/2015  . Secondary adrenal insufficiency ([place]) 01/09/2015  . Pituitary lesion ([place]) 12/06/2014  . Hyperprolactinemia ([place]) 10/23/2014  . Mass of pituitary [facility]) 10/08/2014    Past Surgical History:  Procedure Laterality Date  . CESAREAN SECTION    . PARTIAL HYSTERECTOMY  2012   [facility]    OB History    Gravida Para Term Preterm AB Living   1 1 1     1    SAB TAB Ectopic Multiple Live Births         1       Home Medications    Prior to Admission medications   Medication Sig Start Date End Date Taking? Authorizing Provider  Acetaminophen (MAPAP) 500 MG coapsule Take by mouth.   Yes [provider]  escitalopram (LEXAPRO) 10 MG tablet Take 1 tablet (10 mg total) by mouth daily. 10/30/16 10/30/17 Yes Malachy Mood, MD  hydrOXYzine (ATARAX/VISTARIL) 25 MG tablet Take 1 tablet (25 mg total) by mouth every 6 (six) hours as needed for itching. 10/30/16  Yes Malachy Mood, MD  Multiple Vitamin (MULTI-VITAMINS) TABS Take by mouth.   Yes [provider]  naproxen (NAPROSYN) 500 MG tablet Take 1 tablet (500 mg total) by mouth 2 (two) times daily with a meal. 12/04/16   Lorin Picket, PA-C    Family History Family History  Problem Relation Age of Onset  . Hypertension Mother   . Diabetes Father   . Hypertension Father   . Hypertension Sister   . Diabetes Maternal Grandmother   . Hypertension Maternal Grandmother   . Diabetes Paternal Grandmother   . Hypertension Paternal Grandmother   . Lung cancer Paternal Grandfather   . Skin cancer Maternal Grandfather     Social History Social History   Tobacco Use  . Smoking status: Never Smoker  . Smokeless tobacco: Never  Used  Substance Use Topics  . Alcohol use: No  . Drug use: No     Allergies   Patient has no known allergies.   Review of Systems Review of Systems  Constitutional: Positive for activity change. Negative for chills, fatigue and fever.  Musculoskeletal: Positive for back pain, gait problem and neck pain.  All other systems reviewed and are negative.    Physical Exam Triage Vital Signs ED Triage Vitals  Enc Vitals Group     BP 12/04/16 0916 128/71     Pulse Rate 12/04/16 0916 63     Resp 12/04/16 0916 16     Temp 12/04/16 0916 98 F (36.7 C)     Temp Source 12/04/16 0916 Oral     SpO2 12/04/16 0916 96 %     Weight 12/04/16 0915 206 lb (93.4 kg)     Height 12/04/16  0915 5\' 4"  (1.626 m)     Head Circumference --      Peak Flow --      Pain Score 12/04/16 0913 7     Pain Loc --      Pain Edu? --      Excl. in [place]? --    No data found.  Updated Vital Signs BP 128/71 (BP Location: Left Arm)   Pulse 63   Temp 98 F (36.7 C) (Oral)   Resp 16   Ht 5\' 4"  (1.626 m)   Wt 206 lb (93.4 kg)   SpO2 96%   BMI 35.36 kg/m   Visual Acuity Right Eye Distance:   Left Eye Distance:   Bilateral Distance:    Right Eye Near:   Left Eye Near:    Bilateral Near:     Physical Exam  Constitutional: She is oriented to person, place, and time. She appears well-developed and well-nourished. No distress.  HENT:  Head: Normocephalic.  Eyes: Pupils are equal, round, and reactive to light.  Neck:  Of the neck shows fairly good range of motion discomfort only with rightward rotation causing her to have left-sided paracervical muscle pain.  Upper examination is normal.  Musculoskeletal:  Examination of the lumbar spine was performed with Nevin Bloodgood, CMA as assistant/ chaperone.  It has a level pelvis in stance.  She is able to forward flex return to upright posture and lateral flex bilaterally normally.  Lateral flexion to the left causes right sided buttock and posterior hip pain.  Able to toe and heel walk adequately.  EHL peroneal and anterior tibialis muscles are strong to clinical testing.  Patient is intact throughout the lower extremity.  DTRs are 2+/4 bilaterally symmetrical.  Leg raise testing is -90 degrees on the left positive at between 80 and 90 degrees on the right with low back pain and posterior hip pain.  Internal rotation of the hip reproduces her pain in the buttock.  Neurological: She is alert and oriented to person, place, and time.  Skin: Skin is warm and dry. She is not diaphoretic.  Psychiatric: She has a normal mood and affect. Her behavior is normal. Judgment and thought content normal.  Nursing note and vitals reviewed.    UC Treatments / Results   Labs (all labs ordered are listed, but only abnormal results are displayed) Labs Reviewed - No data to display  EKG  EKG Interpretation None       Radiology No results found.  Procedures Procedures (including critical care time)  Medications Ordered in UC Medications - No data to display  Initial Impression / Assessment and Plan / UC Course  I have reviewed the triage vital signs and the nursing notes.  Pertinent labs & imaging results that were available during my care of the patient were reviewed by me and considered in my medical decision making (see chart for details).     Plan: 1. Test/x-ray results and diagnosis reviewed with patient 2. rx as per orders; risks, benefits, potential side effects reviewed with patient 3. Recommend supportive treatment with test and symptom avoidance.  Stop ibuprofen at the present time and switch her to Naprosyn 500 twice daily with food.  Would recommend her following up with her primary care physician for further evaluation and possible imaging of the right hip. 4. F/u prn if symptoms worsen or don't improve   Final Clinical Impressions(s) / UC Diagnoses   Final diagnoses:  Acute right-sided low back pain with sciatica, sciatica laterality unspecified  Cervical sprain, initial encounter  Right hip pain    ED Discharge Orders        Ordered    naproxen (NAPROSYN) 500 MG tablet  2 times daily with meals     12/04/16 1107       Controlled Substance Prescriptions [place] Controlled Substance Registry consulted? Not Applicable   Lorin Picket, PA-C 12/04/16 1118

## 2016-12-04 NOTE — ED Triage Notes (Addendum)
As per patient hip pain and neck pain, lower back pain onset yesteraday she slipped and tripped and now has hip pain work requires note to go back.

## 2016-12-18 ENCOUNTER — Encounter: Payer: Self-pay | Admitting: *Deleted

## 2017-04-02 ENCOUNTER — Other Ambulatory Visit: Payer: Self-pay | Admitting: Obstetrics and Gynecology

## 2017-04-02 MED ORDER — HYDROXYZINE HCL 25 MG PO TABS
25.0000 mg | ORAL_TABLET | Freq: Four times a day (QID) | ORAL | 2 refills | Status: DC | PRN
Start: 2017-04-02 — End: 2023-11-05

## 2017-06-28 ENCOUNTER — Telehealth: Payer: Self-pay

## 2017-06-28 NOTE — Telephone Encounter (Signed)
Pharmacy requesting a refill on Trazodone 50mg  (substituted for Desyrel)

## 2017-06-29 ENCOUNTER — Other Ambulatory Visit: Payer: Self-pay | Admitting: Obstetrics and Gynecology

## 2017-06-29 MED ORDER — TRAZODONE HCL 50 MG PO TABS: 50.0000 mg | ORAL_TABLET | Freq: Every evening | ORAL | 6 refills | Status: DC | PRN

## 2018-03-04 ENCOUNTER — Telehealth: Payer: Self-pay

## 2018-03-04 NOTE — Telephone Encounter (Signed)
Needs follow up hasn't been seen in 1.5 years

## 2018-03-04 NOTE — Telephone Encounter (Signed)
[facility] faxed over a refill request for Trazodone 50mg .   Please advise

## 2018-03-07 NOTE — Telephone Encounter (Signed)
Unable to reach patient. I called pharmacy to deny refill.

## 2018-12-14 ENCOUNTER — Other Ambulatory Visit: Payer: Self-pay | Admitting: Family Medicine

## 2018-12-14 DIAGNOSIS — Z1231 Encounter for screening mammogram for malignant neoplasm of breast: Secondary | ICD-10-CM

## 2018-12-16 ENCOUNTER — Ambulatory Visit
Admission: RE | Admit: 2018-12-16 | Discharge: 2018-12-16 | Disposition: A | Discharge status: Home / Self Care | Payer: BC Managed Care – PPO | Source: Ambulatory Visit | Attending: Family Medicine | Admitting: Family Medicine

## 2018-12-16 ENCOUNTER — Other Ambulatory Visit: Payer: Self-pay

## 2018-12-16 DIAGNOSIS — Z1231 Encounter for screening mammogram for malignant neoplasm of breast: Secondary | ICD-10-CM | POA: Insufficient documentation

## 2018-12-19 ENCOUNTER — Other Ambulatory Visit: Payer: Self-pay | Admitting: *Deleted

## 2018-12-19 ENCOUNTER — Inpatient Hospital Stay
Admission: RE | Admit: 2018-12-19 | Discharge: 2018-12-19 | Disposition: A | Discharge status: Home / Self Care | Payer: Self-pay | Source: Ambulatory Visit | Attending: *Deleted | Admitting: *Deleted

## 2018-12-19 DIAGNOSIS — Z1231 Encounter for screening mammogram for malignant neoplasm of breast: Secondary | ICD-10-CM

## 2020-01-15 ENCOUNTER — Other Ambulatory Visit: Payer: Self-pay | Admitting: Family Medicine

## 2020-01-15 DIAGNOSIS — Z1231 Encounter for screening mammogram for malignant neoplasm of breast: Secondary | ICD-10-CM

## 2020-06-18 ENCOUNTER — Ambulatory Visit
Admission: RE | Admit: 2020-06-18 | Discharge: 2020-06-18 | Disposition: A | Discharge status: Home / Self Care | Payer: BC Managed Care – PPO | Source: Ambulatory Visit | Attending: Family Medicine | Admitting: Family Medicine

## 2020-06-18 ENCOUNTER — Other Ambulatory Visit: Payer: Self-pay

## 2020-06-18 DIAGNOSIS — Z1231 Encounter for screening mammogram for malignant neoplasm of breast: Secondary | ICD-10-CM

## 2020-09-30 ENCOUNTER — Ambulatory Visit
Admission: RE | Admit: 2020-09-30 | Discharge: 2020-09-30 | Disposition: A | Discharge status: Home / Self Care | Payer: BC Managed Care – PPO | Source: Ambulatory Visit | Attending: Internal Medicine | Admitting: Internal Medicine

## 2020-09-30 ENCOUNTER — Other Ambulatory Visit: Payer: Self-pay

## 2020-09-30 VITALS — BP 156/98 | HR 78 | Temp 98.7°F | Resp 18 | Ht 64.0 in | Wt 187.0 lb

## 2020-09-30 DIAGNOSIS — R21 Rash and other nonspecific skin eruption: Secondary | ICD-10-CM | POA: Diagnosis not present

## 2020-09-30 MED ORDER — PREDNISONE 20 MG PO TABS: 20.0000 mg | ORAL_TABLET | Freq: Every day | ORAL | 0 refills | Status: AC

## 2020-09-30 NOTE — ED Triage Notes (Signed)
Pt here with C/O rash/bumps on hands, and tongue. Painful, was exposed to granddaughter who was DX with HFM

## 2020-09-30 NOTE — Discharge Instructions (Addendum)
Please quarantine until monkeypox results are available Please take medications as prescribed Please use only mild or hypoallergenic soap when washing your hands Avoid harsh chemicals on the hands Return to urgent care if symptoms worsen or the rash spreads beyond current areas involved We will call you with recommendations if labs are abnormal.

## 2020-09-30 NOTE — ED Provider Notes (Signed)
[facility]    CSN: [id number] Arrival date & time: 09/30/20  0834      History   Chief Complaint Chief Complaint  Patient presents with   Rash    Appointment 9am    HPI Frances Mckay is a 55 y.o. female comes to the urgent care with 3-4-day history of rash in the palm of both hands as well as the trunk.  Patient was recently exposed to a grandchild who had hand-foot-and-mouth disease.  Patient says the rash started and it seems to be spreading in the palm of both hands.  The rash is painful on occasion but it is also associated with burning sensation in the hands.  She has tried Benadryl cream with no improvement in her symptoms.  She denies any changes in cosmetics or soaps.  She works in Copy at school.  She says the soap was changed on Friday and that coincides with when her symptoms started.  No fever or chills.  No swelling of fingers.   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   Other specified hypothyroidism 01/09/2015   Secondary adrenal insufficiency ([place]) 01/09/2015   Pituitary lesion ([place]) 12/06/2014   Hyperprolactinemia ([place]) 10/23/2014   Mass of pituitary ([place]) 10/08/2014    Past Surgical History:  Procedure Laterality Date   CESAREAN SECTION     PARTIAL HYSTERECTOMY  2012   [facility]    OB History     Gravida  1   Para  1   Term  1   Preterm      AB      Living  1       SAB      IAB      Ectopic      Multiple      Live Births  1            Home Medications    Prior to Admission medications   Medication Sig Start Date End Date Taking? Authorizing Provider  Acetaminophen 500 MG capsule Take by mouth.   Yes [provider]  diazepam (VALIUM) 5 MG tablet SMARTSIG:1 Tablet(s) By Mouth Every 12 Hours PRN 08/08/20  Yes [provider]  meloxicam (MOBIC) 15 MG tablet Take 15 mg by mouth daily. 08/29/20  Yes [provider]  Multiple Vitamin (MULTI-VITAMINS) TABS Take by mouth.   Yes [provider]  PARoxetine (PAXIL) 20 MG tablet Take 20 mg by mouth daily. 08/29/20  Yes [provider]  predniSONE (DELTASONE) 20 MG tablet Take 1 tablet (20 mg total) by mouth daily for 5 days. 09/30/20 10/05/20 Yes Barbi Kumagai, Myrene Galas, MD  rosuvastatin (CRESTOR) 5 MG tablet Take 1 tablet by mouth daily. 04/12/20  Yes [provider]  escitalopram (LEXAPRO) 10 MG tablet Take 1 tablet (10 mg total) by mouth daily. 10/30/16 10/30/17  Malachy Mood, MD  hydrOXYzine (ATARAX/VISTARIL) 25 MG tablet Take 1 tablet (25 mg total) by mouth every 6 (  six) hours as needed for itching. 04/02/17   Malachy Mood, MD  naproxen (NAPROSYN) 500 MG tablet Take 1 tablet (500 mg total) by mouth 2 (two) times daily with a meal. 12/04/16   Lorin Picket, PA-C  traZODone (DESYREL) 50 MG tablet Take 1 tablet (50 mg total) by mouth at bedtime as needed for sleep. 06/29/17   Malachy Mood, MD    Family History Family History  Problem Relation Age of Onset   Hypertension Mother    Diabetes Father    Hypertension Father    Hypertension Sister    Diabetes Maternal Grandmother    Hypertension Maternal Grandmother    Diabetes Paternal Grandmother    Hypertension Paternal Grandmother    Lung cancer Paternal Grandfather    Skin cancer Maternal Grandfather    Breast cancer Neg Hx     Social History Social History   Tobacco Use    Smoking status: Never   Smokeless tobacco: Never  Vaping Use   Vaping Use: Never used  Substance Use Topics   Alcohol use: No   Drug use: No     Allergies   Patient has no known allergies.   Review of Systems Review of Systems  Constitutional: Negative.   HENT: Negative.    Gastrointestinal: Negative.   Musculoskeletal: Negative.   Skin:  Positive for color change and rash. Negative for wound.    Physical Exam Triage Vital Signs ED Triage Vitals  Enc Vitals Group     BP 09/30/20 0907 (!) 156/98     Pulse Rate 09/30/20 0907 78     Resp 09/30/20 0907 18     Temp 09/30/20 0907 98.7 F (37.1 C)     Temp Source 09/30/20 0907 Oral     SpO2 09/30/20 0907 100 %     Weight 09/30/20 0905 187 lb (84.8 kg)     Height 09/30/20 0905 5\' 4"  (1.626 m)     Head Circumference --      Peak Flow --      Pain Score 09/30/20 0905 8     Pain Loc --      Pain Edu? --      Excl. in [place]? --    No data found.  Updated Vital Signs BP (!) 156/98 (BP Location: Right Arm)   Pulse 78   Temp 98.7 F (37.1 C) (Oral)   Resp 18   Ht 5\' 4"  (1.626 m)   Wt 84.8 kg   SpO2 100%   BMI 32.10 kg/m   Visual Acuity Right Eye Distance:   Left Eye Distance:   Bilateral Distance:    Right Eye Near:   Left Eye Near:    Bilateral Near:     Physical Exam Vitals and nursing note reviewed.  Constitutional:      General: She is not in acute distress.    Appearance: She is not ill-appearing.  Musculoskeletal:        General: No swelling or tenderness. Normal range of motion.  Skin:    General: Skin is warm.     Comments: Multiple pustular vesicular rashes involving the palm of both hands.  Rash is well-circumscribed with erythematous ring around it.  The rash ranges from 60mm to 5 mm in the largest diameter.  Some of the rashes contain clear fluid was at this have purulent looking fluid.  Neurological:     Mental Status: She is alert.     UC Treatments / Results  Labs (all labs  ordered are  listed, but only abnormal results are displayed) Labs Reviewed  MONKEYPOX VIRUS DNA, QUALITATIVE REAL-TIME PCR  MONKEYPOX VIRUS DNA, QUALITATIVE REAL-TIME PCR    EKG   Radiology No results found.  Procedures Procedures (including critical care time)  Medications Ordered in UC Medications - No data to display  Initial Impression / Assessment and Plan / UC Course  I have reviewed the triage vital signs and the nursing notes.  Pertinent labs & imaging results that were available during my care of the patient were reviewed by me and considered in my medical decision making (see chart for details).     1.  Palmar rash: Monkeypox PCR test has been sent I suspect this is contact dermatitis.  A short course of steroids will be given. Patient is advised to quarantine until results are available Return precautions given. Final Clinical Impressions(s) / UC Diagnoses   Final diagnoses:  Rash of both hands     Discharge Instructions      Please quarantine until monkeypox results are available Please take medications as prescribed Please use only mild or hypoallergenic soap when washing your hands Avoid harsh chemicals on the hands Return to urgent care if symptoms worsen or the rash spreads beyond current areas involved We will call you with recommendations if labs are abnormal.   ED Prescriptions     Medication Sig Dispense Auth. Provider   predniSONE (DELTASONE) 20 MG tablet Take 1 tablet (20 mg total) by mouth daily for 5 days. 5 tablet Donette Mainwaring, Myrene Galas, MD      PDMP not reviewed this encounter.   Chase Picket, MD 09/30/20 [phone]

## 2020-10-01 LAB — MONKEYPOX VIRUS DNA, QUALITATIVE REAL-TIME PCR
Orthopoxvirus DNA, QL PCR: NOT DETECTED
Orthopoxvirus DNA, QL PCR: NOT DETECTED

## 2021-05-21 ENCOUNTER — Other Ambulatory Visit: Payer: Self-pay | Admitting: Family Medicine

## 2021-05-21 DIAGNOSIS — Z1231 Encounter for screening mammogram for malignant neoplasm of breast: Secondary | ICD-10-CM

## 2021-06-19 ENCOUNTER — Ambulatory Visit
Admission: RE | Admit: 2021-06-19 | Discharge: 2021-06-19 | Disposition: A | Discharge status: Home / Self Care | Payer: BC Managed Care – PPO | Source: Ambulatory Visit | Attending: Family Medicine | Admitting: Family Medicine

## 2021-06-19 DIAGNOSIS — Z1231 Encounter for screening mammogram for malignant neoplasm of breast: Secondary | ICD-10-CM | POA: Insufficient documentation

## 2022-05-04 IMAGING — MG MM DIGITAL SCREENING BILAT W/ TOMO AND CAD
8 series · 8 of 24 positions shown · non-contrast
Comparison: Previous exam(s).

ACR Breast Density Category a: The breast tissue is almost entirely
fatty.

CLINICAL DATA: Screening.

EXAM:
DIGITAL SCREENING BILATERAL MAMMOGRAM WITH TOMOSYNTHESIS AND CAD
TECHNIQUE: Bilateral screening digital craniocaudal and mediolateral oblique
mammograms were obtained. Bilateral screening digital breast
tomosynthesis was performed. The images were evaluated with
computer-aided detection.

[R CC synth-2D]
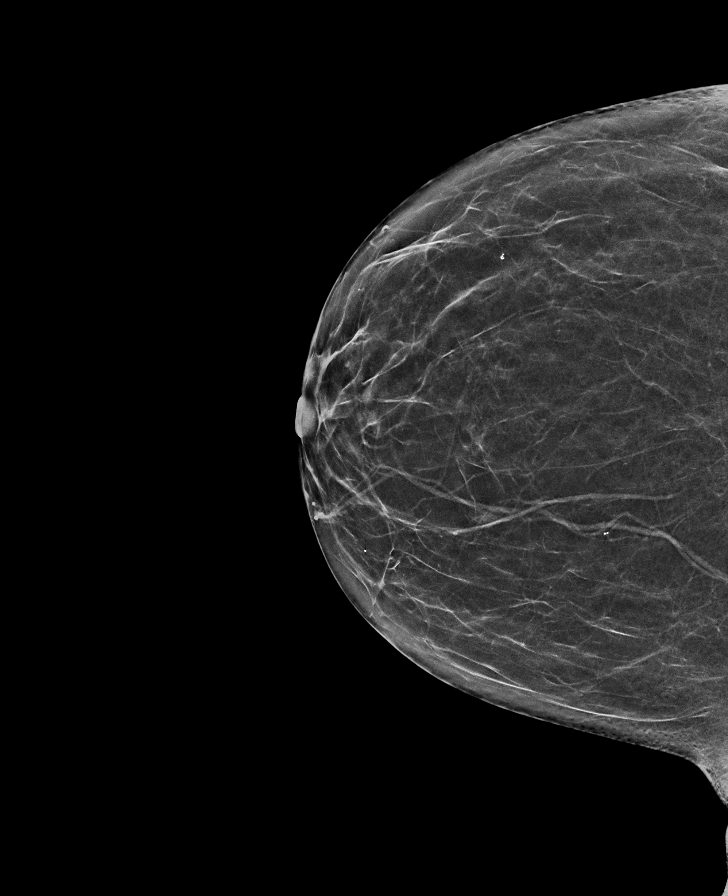

[R MLO synth-2D]
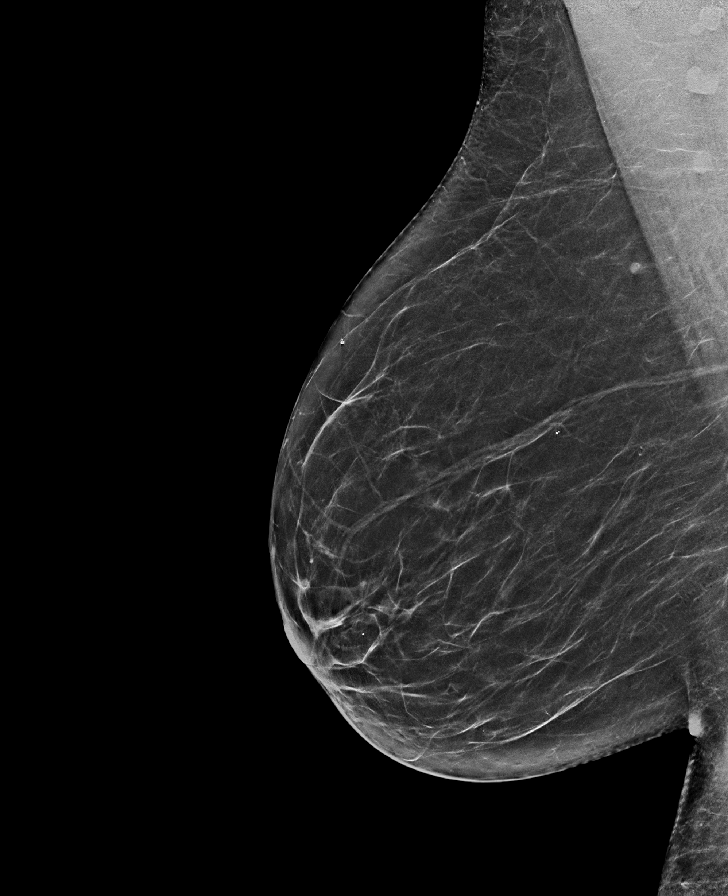

[L CC synth-2D]
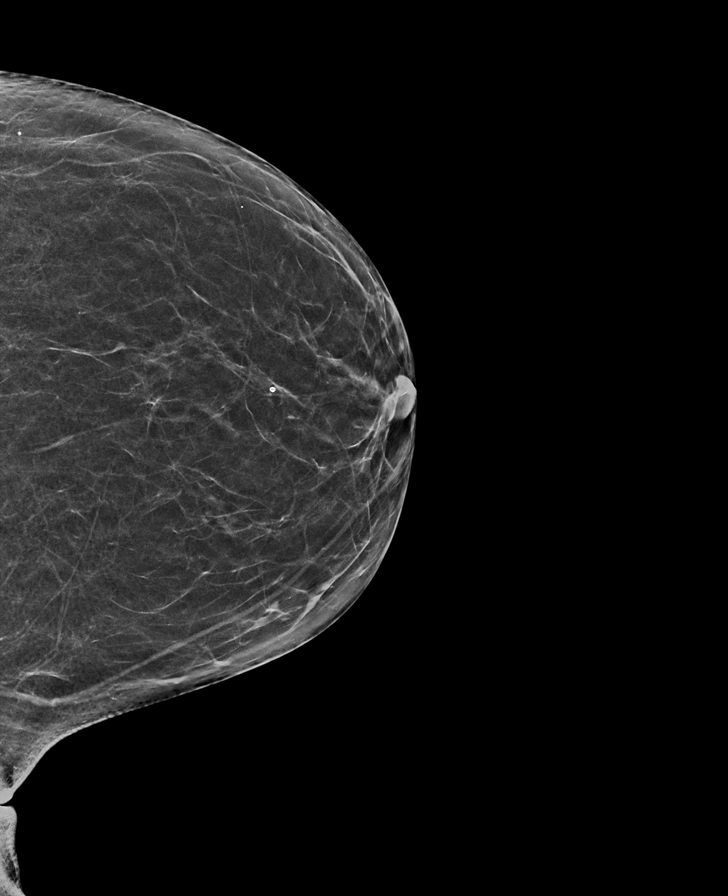

[L MLO synth-2D]
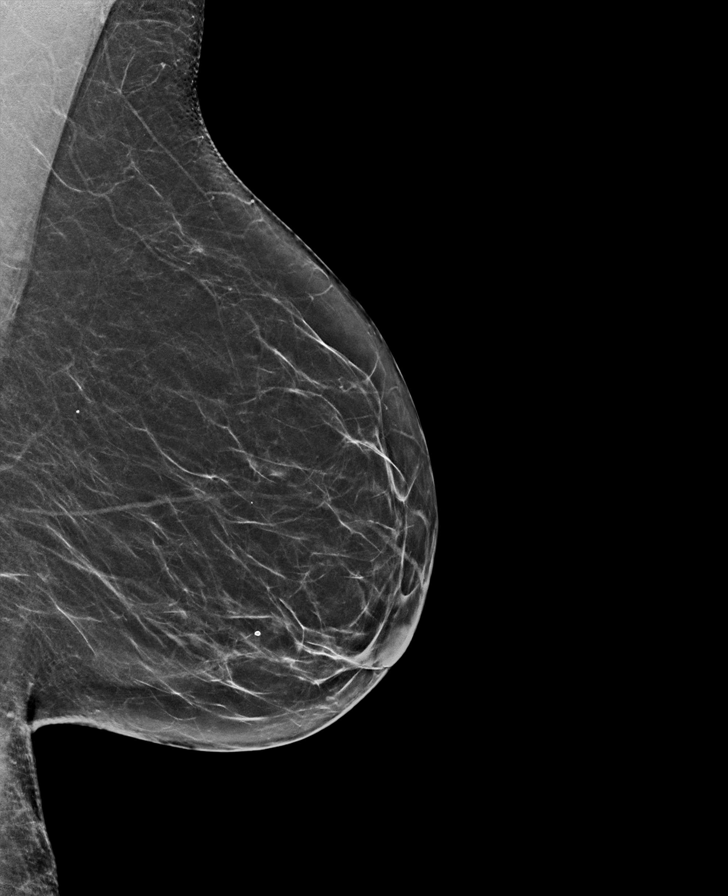

[L CC tomo · tomo slice 28/55.0]
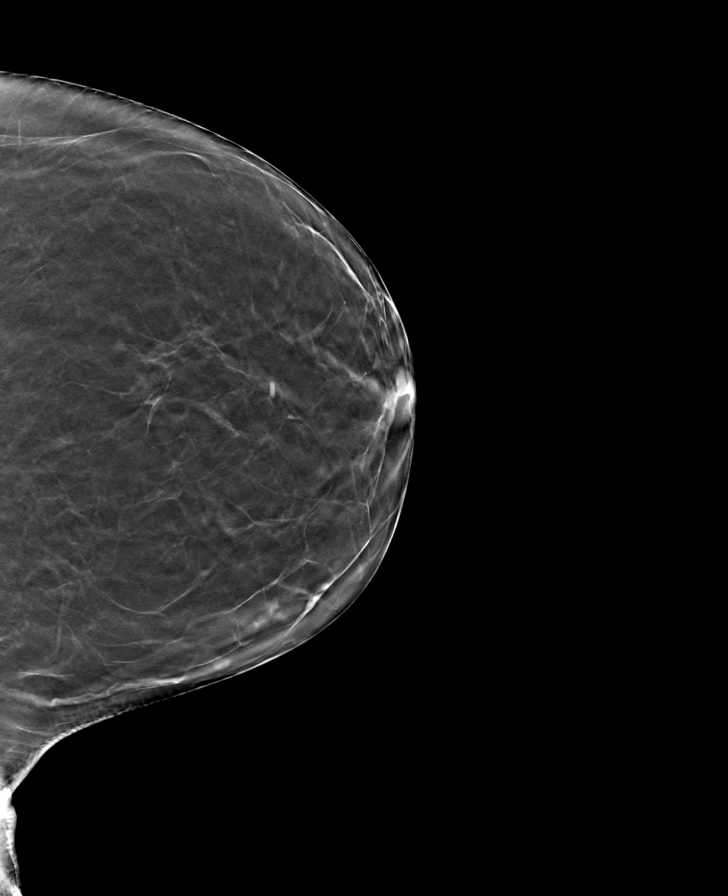

[L MLO tomo · tomo slice 32/63.0]
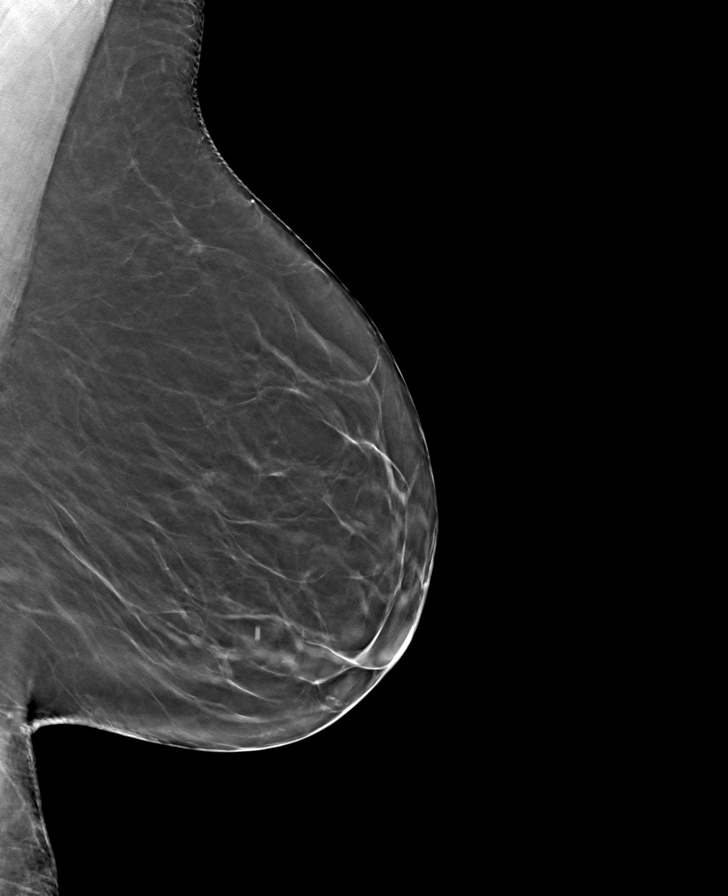

[R CC tomo · tomo slice 29/58.0]
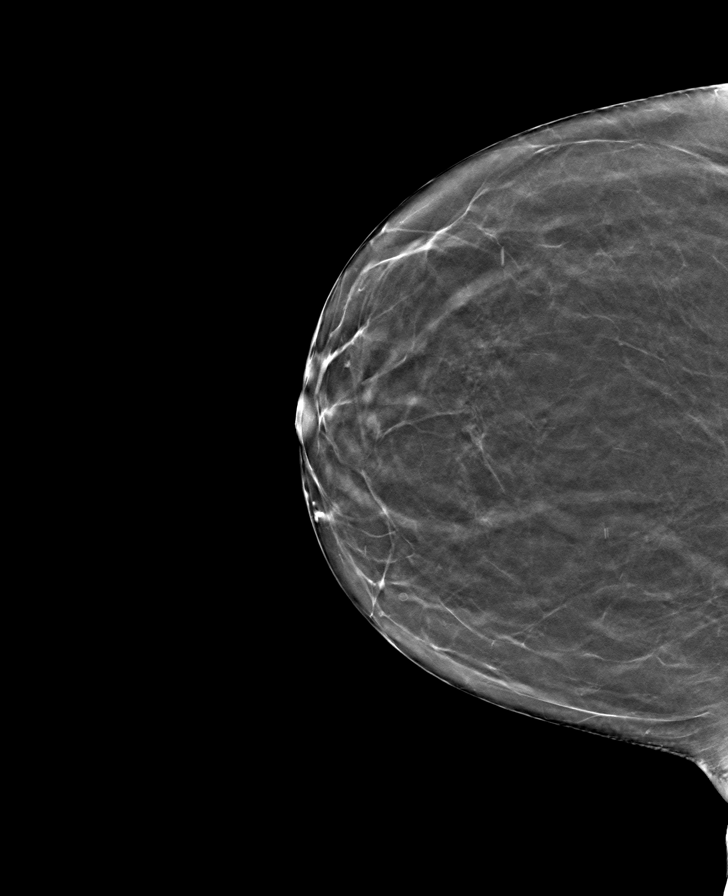

[R MLO tomo · tomo slice 35/68.0]
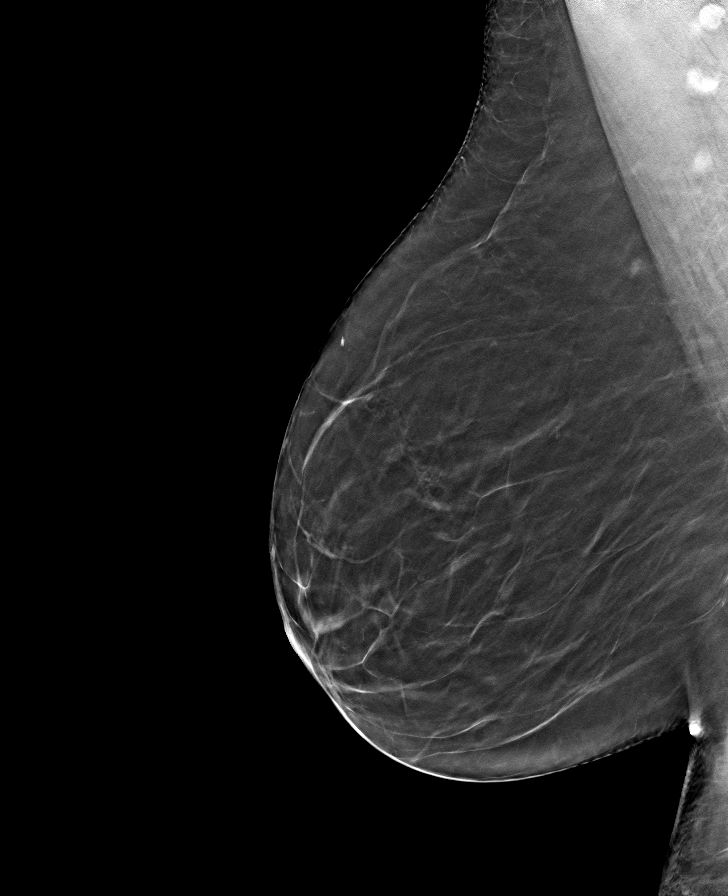

[8 of 24 positions shown; findings below may reference images not displayed]

FINDINGS: There are no findings suspicious for malignancy. The images were
evaluated with computer-aided detection.
IMPRESSION: No mammographic evidence of malignancy. A result letter of this
screening mammogram will be mailed directly to the patient.

RECOMMENDATION:
Screening mammogram in one year. (Code:JP-J-DD5)

BI-RADS CATEGORY  1: Negative.

## 2022-05-12 ENCOUNTER — Ambulatory Visit
Admission: EM | Admit: 2022-05-12 | Discharge: 2022-05-12 | Disposition: A | Discharge status: Home / Self Care | Payer: BC Managed Care – PPO | Attending: Emergency Medicine | Admitting: Emergency Medicine

## 2022-05-12 DIAGNOSIS — I498 Other specified cardiac arrhythmias: Secondary | ICD-10-CM

## 2022-05-12 NOTE — ED Provider Notes (Signed)
[facility]    CSN: [id number] Arrival date & time: 05/12/22  1478      History   Chief Complaint Chief Complaint  Patient presents with   Irregular Heart Beat    HPI Frances Mckay is a 57 y.o. female.   Patient presents for evaluation of heart fluttering that has been occurring for months, over the last week symptoms are progressively worsening occurring more frequently and lasting for longer periods of time.  Typically episodes occurring 1-2 times a week lasting for 2 to 3 minutes before spontaneous resolution.  1 day ago symptoms lasted throughout the entirety of the day.  Feels as if heart is skipping beat and going faster.  Denies a cardiac history or respiratory history.  History of anxiety, taking Paxil daily, dose recently increased 2 weeks ago and she she does feel more relaxed.  Was prescribed Valium and propranolol to be used as needed, typically takes Valium 1-2 times weekly.  Took dose of propranolol this morning approximately at 6:30 AM but no improvement seen in symptoms, has taken approximately 4-5 times since being prescribed, unsure if effective.  Has been having generalized headaches that have been occurring over the last few months, worsening in frequency and severity, 1 day ago had a headache for the day, generalized and described as sharp pains.  Denies photophobia, phonophobia, dizziness, lightheadedness, visual disturbance, nausea or vomiting.  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  Completed follow-up with her PCP approximately 2 weeks ago and at that time deemed stable, medication changes as noted above as patient has had increased stress and anxiety related to home life.        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 Authorizing Provider  Acetaminophen 500 MG capsule Take by mouth.   Yes [provider]  diazepam (VALIUM) 5 MG tablet SMARTSIG:1 Tablet(s) By Mouth Every 12 Hours PRN 08/08/20  Yes [provider]  meloxicam (MOBIC) 15 MG tablet Take 15 mg  by mouth daily. 08/29/20  Yes [provider]  Multiple Vitamin (MULTI-VITAMINS) TABS Take by mouth.   Yes [provider]  PARoxetine (PAXIL) 20 MG tablet Take 20 mg by mouth daily. 08/29/20  Yes [provider]  propranolol (INDERAL) 10 MG tablet Take by mouth. 04/28/22 04/28/23 Yes [provider]  rosuvastatin (CRESTOR) 5 MG tablet Take 1 tablet by mouth daily. 04/12/20  Yes [provider]  escitalopram (LEXAPRO) 10 MG tablet Take 1 tablet (10 mg total) by mouth daily. 10/30/16 10/30/17  Vena Austria, MD  hydrOXYzine (ATARAX/VISTARIL) 25 MG tablet Take 1 tablet (25 mg total) by mouth every 6  (six) hours as needed for itching. 04/02/17   Vena Austria, MD  naproxen (NAPROSYN) 500 MG tablet Take 1 tablet (500 mg total) by mouth 2 (two) times daily with a meal. 12/04/16   Lutricia Feil, PA-C  traZODone (DESYREL) 50 MG tablet Take 1 tablet (50 mg total) by mouth at bedtime as needed for sleep. 06/29/17   Vena Austria, MD    Family History Family History  Problem Relation Age of Onset   Hypertension Mother    Diabetes Father    Hypertension Father    Hypertension Sister    Diabetes Maternal Grandmother    Hypertension Maternal Grandmother    Diabetes Paternal Grandmother    Hypertension Paternal Grandmother    Lung cancer Paternal Grandfather    Skin cancer Maternal Grandfather    Breast cancer Neg Hx     Social History Social History   Tobacco Use   Smoking status: Every Day    Types: Cigarettes   Smokeless tobacco: Never  Vaping Use   Vaping Use: Never used  Substance Use Topics   Alcohol use: No   Drug use: No     Allergies   Patient has no known allergies.   Review of Systems Review of Systems Defer to HPI    Physical Exam Triage Vital Signs ED Triage Vitals  Enc Vitals Group     BP 05/12/22 0838 (!) 151/91     Pulse Rate 05/12/22 0838 (!) 56     Resp --      Temp 05/12/22 0838 98.7 F (37.1 C)     Temp Source 05/12/22 0838 Oral     SpO2 05/12/22 0838 98 %     Weight 05/12/22 0833 175 lb (79.4 kg)     Height 05/12/22 0833 5\' 4"  (1.626 m)     Head Circumference --      Peak Flow --      Pain Score 05/12/22 0831 8     Pain Loc --      Pain Edu? --      Excl. in GC? --    No data found.  Updated Vital Signs BP (!) 151/91 (BP Location: Left Arm)   Pulse (!) 56   Temp 98.7 F (37.1 C) (Oral)   Ht 5\' 4"  (1.626 m)   Wt 175 lb (79.4 kg)   SpO2 98%   BMI 30.04 kg/m   Visual Acuity Right Eye Distance:   Left Eye Distance:   Bilateral Distance:    Right Eye Near:   Left Eye Near:    Bilateral Near:     Physical  Exam Constitutional:      Appearance: Normal appearance.  Eyes:     Extraocular Movements: Extraocular movements intact.  Cardiovascular:     Rate and Rhythm: Normal rate and regular rhythm.  Pulses: Normal pulses.     Heart sounds: Normal heart sounds.  Pulmonary:     Effort: Pulmonary effort is normal.     Breath sounds: Normal breath sounds.  Neurological:     General: No focal deficit present.     Mental Status: She is alert and oriented to person, place, and time. Mental status is at baseline.  Psychiatric:        Mood and Affect: Mood normal.        Behavior: Behavior normal.      UC Treatments / Results  Labs (all labs ordered are listed, but only abnormal results are displayed) Labs Reviewed - No data to display  EKG   Radiology No results found.  Procedures Procedures (including critical care time)  Medications Ordered in UC Medications - No data to display  Initial Impression / Assessment and Plan / UC Course  I have reviewed the triage vital signs and the nursing notes.  Pertinent labs & imaging results that were available during my care of the patient were reviewed by me and considered in my medical decision making (see chart for details).  Fluttering Heart   Vital signs are stable, blood pressure in triage 151/91, per chart review typically within normal range, S1 and S2 heard to auscultation, EKG showing sinus bradycardia with a heart rate of 52, did take propranolol this morning most likely cause, stable for outpatient management, discussed limitations for cardiac workup in the urgent care setting and given referral to cardiology for further evaluation and management, at this time will not make changes to anxiety medicine as Paxil has been effective, given strict precautions for any signs of worsening symptoms to follow-up with the nearest emergency department for further evaluation and management Final Clinical Impressions(s) / UC Diagnoses   Final  diagnoses:  None   Discharge Instructions   None    ED Prescriptions   None    PDMP not reviewed this encounter.   Valinda Hoar, NP 05/12/22 1122

## 2022-05-12 NOTE — Discharge Instructions (Addendum)
Today you have been evaluated for your heart fluttering  On exam normal heart sounds are heard and your EKG shows that your heart is beating in a regular rhythm, heart rate is slowed to 52 but this is most likely due to to the propranolol, this is a known side effect  At this time I believe you will need follow-up with cardiology and therefore you have been given, you may reach out to your doctor to get referral to [facility] cardiology as well, they may be able to see you sooner as you are already a patient of [facility]  Cardiology is recommended as they are able to place you on a monitor to help capture the rhythm that is occurring when you are experiencing the heart fluttering which will help [place] make a plan to minimize symptoms  At any point if you feel symptoms are worsening in severity please go to the nearest emergency department for immediate cardiac workup and further evaluation  Continue to take your Paxil daily as this is helping to manage her anxiety which can sometimes cause a sensation of heart, dose was increased 2 weeks ago and you are already starting to see improvement, typically takes about 4 weeks for medicine to hit its peak and your symptoms should need to improve  For your headaches you may take Tylenol 500 to 1000 mg every 6 hours for management, monitor closely and at any point if you begin to have the worst headache you have ever experienced in your life please go to the nearest emergency department

## 2022-05-12 NOTE — ED Triage Notes (Signed)
Pt c/o continued heart flutters x65months, Headache  Pt saw [facility] on 04/28/22 for blood pressure and was given propranolol to assist with her anxiety and stress  Pt has taken her Propanolol at 6:30 this morning. Pt states that she told to take the medication "only when irritated"   Pt states that she is currently getting a headache and feels "sluggish"  Pt states that she can feel the heart flutters but her heart beat also feels "harder"

## 2022-05-13 ENCOUNTER — Ambulatory Visit: Payer: BC Managed Care – PPO

## 2022-05-22 ENCOUNTER — Other Ambulatory Visit: Payer: Self-pay | Admitting: Internal Medicine

## 2022-05-22 DIAGNOSIS — I2510 Atherosclerotic heart disease of native coronary artery without angina pectoris: Secondary | ICD-10-CM

## 2022-05-22 DIAGNOSIS — I2089 Other forms of angina pectoris: Secondary | ICD-10-CM

## 2022-05-26 ENCOUNTER — Ambulatory Visit
Admission: RE | Admit: 2022-05-26 | Discharge: 2022-05-26 | Disposition: A | Discharge status: Home / Self Care | Payer: BC Managed Care – PPO | Source: Ambulatory Visit | Attending: Internal Medicine | Admitting: Internal Medicine

## 2022-05-26 DIAGNOSIS — I2089 Other forms of angina pectoris: Secondary | ICD-10-CM

## 2022-05-26 DIAGNOSIS — I2510 Atherosclerotic heart disease of native coronary artery without angina pectoris: Secondary | ICD-10-CM

## 2023-06-04 ENCOUNTER — Ambulatory Visit: Payer: Self-pay

## 2023-06-04 DIAGNOSIS — Z860101 Personal history of adenomatous and serrated colon polyps: Secondary | ICD-10-CM | POA: Diagnosis not present

## 2023-06-04 DIAGNOSIS — Z1211 Encounter for screening for malignant neoplasm of colon: Secondary | ICD-10-CM | POA: Diagnosis present

## 2023-06-04 DIAGNOSIS — D123 Benign neoplasm of transverse colon: Secondary | ICD-10-CM | POA: Diagnosis not present

## 2023-06-04 DIAGNOSIS — K640 First degree hemorrhoids: Secondary | ICD-10-CM | POA: Diagnosis not present

## 2023-09-13 ENCOUNTER — Other Ambulatory Visit: Payer: Self-pay | Admitting: Family Medicine

## 2023-09-13 DIAGNOSIS — R1011 Right upper quadrant pain: Secondary | ICD-10-CM

## 2023-09-17 ENCOUNTER — Ambulatory Visit
Admission: RE | Admit: 2023-09-17 | Discharge: 2023-09-17 | Disposition: A | Discharge status: Home / Self Care | Payer: Self-pay | Source: Ambulatory Visit | Attending: Family Medicine | Admitting: Family Medicine

## 2023-09-17 DIAGNOSIS — R1011 Right upper quadrant pain: Secondary | ICD-10-CM | POA: Diagnosis present

## 2023-10-05 ENCOUNTER — Other Ambulatory Visit: Payer: Self-pay | Admitting: Orthopedic Surgery

## 2023-10-05 DIAGNOSIS — M1611 Unilateral primary osteoarthritis, right hip: Secondary | ICD-10-CM

## 2023-10-05 DIAGNOSIS — M1631 Unilateral osteoarthritis resulting from hip dysplasia, right hip: Secondary | ICD-10-CM

## 2023-10-08 ENCOUNTER — Other Ambulatory Visit: Payer: Self-pay | Admitting: Orthopedic Surgery

## 2023-10-12 ENCOUNTER — Ambulatory Visit
Admission: RE | Admit: 2023-10-12 | Discharge: 2023-10-12 | Disposition: A | Discharge status: Home / Self Care | Source: Ambulatory Visit | Attending: Orthopedic Surgery | Admitting: Orthopedic Surgery

## 2023-10-12 DIAGNOSIS — M1611 Unilateral primary osteoarthritis, right hip: Secondary | ICD-10-CM | POA: Insufficient documentation

## 2023-10-12 DIAGNOSIS — M1631 Unilateral osteoarthritis resulting from hip dysplasia, right hip: Secondary | ICD-10-CM | POA: Insufficient documentation

## 2023-11-03 NOTE — Progress Notes (Signed)
 History of Present Illness: Frances Mckay is an 58 y.o. female presents today for history and physical for right anterior total hip arthroplasty with Dr. Lorelle on Levan 12/2023.  She has x-rays showing complete loss of joint space in the right hip with severe degenerative changes.  She has had 10 years of increasing pain with no relief with NSAIDs.  She has had a hip joint injection in the past with only 2 weeks worth of relief.  Her pain is located in the right groin, anterior thigh and is 10 out of 10.  Pain interferes with her quality of life and activities daily living.  Patient is a non-smoker nondiabetic with a BMI of 33.8   Past Medical History: Past Medical History:  Diagnosis Date  . Allergic rhinitis   . Allergy   . Anxiety   . Hyperlipidemia   . Pituitary microadenoma (CMS/HHS-HCC)    s/p resection at [facility] on 12/18/14    Past Surgical History: Past Surgical History:  Procedure Laterality Date  . EXCISION PITUITARY TUMOR TRANSNASAL/TRANSSEPTAL  12/18/2014    Done at [facility].  nonfunction pit microadenoma s/p MIPS, right nasal septal flap on 12/18/14.  Had Doyle splints in place.  Did have high flow intraop CSF leak.   . COLONOSCOPY  07/24/2019   (4) Tubular adenoma of the colon/Repeat 31yrs/[facility]  (07/20/2022 Recall letter returned.[facility])  . Colon @ [facility]  06/04/2023   Tubular adenomas/Repeat 80yrs/[facility]  . CESAREAN SECTION    . [facility]ENTER OPERATIVE REPORT    . FRACTURE SURGERY     Ankle  . HYSTERECTOMY    . ORIF RIGHT TRIMALLEOLAR ANKLE FRACTURE-DISLOCATION      Past Family History: Family History  Problem Relation Age of Onset  . Myocardial Infarction (Heart attack) Father   . High blood pressure (Hypertension) Father   . Diabetes Father   . Colon polyps Father   . Skin cancer Father   . COPD Father   . Diabetes type II Father   . Heart disease Father   . Hyperlipidemia (Elevated cholesterol) Father   . Lung cancer Father   . Sleep apnea Father   . High blood  pressure (Hypertension) Mother   . Hyperlipidemia (Elevated cholesterol) Mother   . Colon cancer Maternal Grandfather   . Gout Maternal Grandfather   . Diabetes type II Maternal Grandmother   . Heart disease Maternal Grandmother   . High blood pressure (Hypertension) Maternal Grandmother   . Lung cancer Paternal Grandfather   . Diabetes type II Paternal Grandmother   . High blood pressure (Hypertension) Paternal Grandmother   . Stroke Paternal Grandmother   . Stroke Paternal Aunt   . Sleep apnea Sister     Medications: Current Outpatient Medications  Medication Sig Dispense Refill  . acetaminophen (TYLENOL) 500 mg capsule Take 1,000 mg by mouth every 4 (four) hours as needed for Fever. Extra strength    . aspirin 81 MG EC tablet TAKE 1 TABLET BY MOUTH EVERY DAY 90 tablet 3  . diazePAM (VALIUM) 5 MG tablet TAKE 1 TABLET(5 MG) BY MOUTH EVERY 12 HOURS AS NEEDED FOR ANXIETY 30 tablet 0  . metoprolol TARTrate (LOPRESSOR) 25 MG tablet TAKE 1 TABLET(25 MG) BY MOUTH TWICE DAILY 180 tablet 3  . PARoxetine (PAXIL) 30 MG tablet TAKE 1 TABLET BY MOUTH EVERY DAY 135 tablet 3  . rosuvastatin (CRESTOR) 5 MG tablet Take 1 tablet (5 mg total) by mouth once daily 90 tablet 3   No current facility-administered medications  for this visit.    Allergies: No Known Allergies   Visit Vitals: Vitals:   11/03/23 1401  BP: 118/80     Review of Systems:  A comprehensive 14 point ROS was performed, reviewed, and the pertinent orthopaedic findings are documented in the HPI.   Physical Exam: Body mass index is 33.44 kg/m. General:  Well developed, well nourished, no apparent distress, normal affect, normal gait with mildly antalgic gait with no assistive vices  HEENT: Head normocephalic, atraumatic, PERRL.   Abdomen: Soft, non tender, non distended, Bowel sounds present.  Heart: Examination of the heart reveals regular, rate, and rhythm.  There is no murmur noted on ascultation.  There is a  normal apical pulse.  Lungs: Lungs are clear to auscultation.  There is no wheeze, rhonchi, or crackles.  There is normal expansion of bilateral chest walls.     Right hip exam  SKIN: intact SWELLING: none WARMTH: no warmth TENDERNESS: none, Stinchfield Positive ROM: 0 degrees internal rotation and 30 degrees external rotation and pain with internal rotation,; Hip Flexion 80 STRENGTH: limited by pain GAIT: antalgic with a limp favoring the right STABILITY: stable to testing CREPITUS: yes LEG LENGTH DISCREPANCY: left longer by .5 cm NEUROLOGICAL EXAM: normal VASCULAR EXAM: normal LUMBAR SPINE:  tenderness: no    straight leg raising sign: no    motor exam: normal  The contralateral hip was examined for comparison and it showed: TENDERNESS: none ROM: normal and full STRENGTH: normal STABILITY: stable to testing  Hip Imaging :  Right hip x-rays reviewed by me today show l severe degenerative changes with dysplastic solid remodeling of the right acetabulum.  There is bone-on-bone articulation with lateralization of the hip center superior and lateral erosion of the acetabular dome.  There is femoral head flattening and cystic changes as well as sclerosis.  The right leg is significantly shorter than the left on x-ray.  The left hip shows mild degenerative changes with some medial and inferior joint space narrowing.  No fractures or dislocations noted about either hip or the pelvis.     Assessment:  Encounter Diagnosis  Name Primary?  . Primary osteoarthritis of right hip Yes     Plan: Frances Mckay is a 58 year old female here today for history and physical for right anterior total hip arthroplasty with Dr. Lorelle 11/17/2023.  She has had greater than 10 years of right groin and thigh pain that has been progressively increasing in interfere with her quality of life and activities day living.  X-rays show severe degenerative changes of the right hip joint with complete loss of superior  acetabular joint space.  Risks, benefits, complications of a right anterior total hip arthroplasty have been discussed with the patient.  Patient has agreed and consented procedure with Dr. Lorelle on 11/17/2023.  The hospitalization and post-operative care and rehabilitation were also discussed. The use of perioperative antibiotics and DVT prophylaxis were discussed. The risk, benefits and alternatives to a surgical intervention were discussed at length with the patient. The patient was also advised of risks related to the medical comorbidities and elevated body mass index (BMI). A lengthy discussion took place to review the most common complications including but not limited to: deep vein thrombosis, pulmonary embolus, heart attack, stroke, infection, wound breakdown, heterotopic ossification, dislocation, numbness, leg length in-equality, intraoperative fracture, damage to nerves, tendon,muscles, arteries or other blood vessels, death and other possible complications from anesthesia. The patient was told that we will take steps to minimize these risks by  using sterile technique, antibiotics and DVT prophylaxis when appropriate and follow the patient postoperatively in the office setting to monitor progress. The possibility of recurrent pain, no improvement in pain and actual worsening of pain were also discussed with the patient. The risk of dislocation following total hip replacement was discussed and potential precautions to prevent dislocation were reviewed.

## 2023-11-04 NOTE — Patient Instructions (Signed)
 Your procedure is scheduled on:11-17-23 Wednesday Report to the Registration Desk on the 1st floor of the [facility].Then proceed to the 2nd floor Surgery Desk To find out your arrival time, please call [phone] between 1PM - 3PM on:11-16-23 Tuesday If your arrival time is 6:00 am, do not arrive before that time as the [facility] entrance doors do not open until 6:00 am.  REMEMBER: Instructions that are not followed completely may result in serious medical risk, up to and including death; or upon the discretion of your surgeon and anesthesiologist your surgery may need to be rescheduled.  Do not eat food after midnight the night before surgery.  No gum chewing or hard candies.  You may however, drink CLEAR liquids up to 2 hours before you are scheduled to arrive for your surgery. Do not drink anything within 2 hours of your scheduled arrival time.  Clear liquids include: - water  - apple juice without pulp - gatorade (not RED colors) - black coffee or tea (Do NOT add milk or creamers to the coffee or tea) Do NOT drink anything that is not on this list.  In addition, your doctor has ordered for you to drink the provided:  Ensure Pre-Surgery Clear Carbohydrate Drink  Drinking this carbohydrate drink up to two hours before surgery helps to reduce insulin resistance and improve patient outcomes. Please complete drinking 2 hours before scheduled arrival time.  One week prior to surgery:Last dose will be on 11-09-23 Stop Anti-inflammatories (NSAIDS) such as Advil, Aleve , Ibuprofen, Motrin, Naproxen , Naprosyn  and Aspirin based products such as Excedrin, Goody's Powder, BC Powder. Stop ANY OVER THE COUNTER supplements until after surgery.  You may however, continue to take Tylenol if needed for pain up until the day of surgery.  Continue taking all of your other prescription medications up until the day of surgery.  ON THE DAY OF SURGERY ONLY TAKE THESE MEDICATIONS WITH SIPS OF  WATER: -metoprolol tartrate (LOPRESSOR)  -PARoxetine (PAXIL)  -You may take diazepam (VALIUM) if needed for anxiety  Continue your 81 mg Aspirin up until the day prior to surgery-Do NOT take the morning of surgery  No Alcohol for 24 hours before or after surgery.  No Smoking including e-cigarettes for 24 hours before surgery.  No chewable tobacco products for at least 6 hours before surgery.  No nicotine patches on the day of surgery.  Do not use any recreational drugs for at least a week (preferably 2 weeks) before your surgery.  Please be advised that the combination of cocaine and anesthesia may have negative outcomes, up to and including death. If you test positive for cocaine, your surgery will be cancelled.  On the morning of surgery brush your teeth with toothpaste and water, you may rinse your mouth with mouthwash if you wish. Do not swallow any toothpaste or mouthwash.  Use CHG Soap as directed on instruction sheet.  Do not wear jewelry, make-up, hairpins, clips or nail polish.  For welded (permanent) jewelry: bracelets, anklets, waist bands, etc.  Please have this removed prior to surgery.  If it is not removed, there is a chance that hospital personnel will need to cut it off on the day of surgery.  Do not wear lotions, powders, or perfumes.   Do not shave body hair from the neck down 48 hours before surgery.  Contact lenses, hearing aids and dentures may not be worn into surgery.  Do not bring valuables to the hospital. [facility] is not responsible for  any missing/lost belongings or valuables.   Notify your doctor if there is any change in your medical condition (cold, fever, infection).  Wear comfortable clothing (specific to your surgery type) to the hospital.  After surgery, you can help prevent lung complications by doing breathing exercises.  Take deep breaths and cough every 1-2 hours. Your doctor may order a device called an Incentive Spirometer to help  you take deep breaths. When coughing or sneezing, hold a pillow firmly against your incision with both hands. This is called "splinting." Doing this helps protect your incision. It also decreases belly discomfort.  If you are being admitted to the hospital overnight, leave your suitcase in the car. After surgery it may be brought to your room.  In case of increased patient census, it may be necessary for you, the patient, to continue your postoperative care in the Same Day Surgery department.  If you are being discharged the day of surgery, you will not be allowed to drive home. You will need a responsible individual to drive you home and stay with you for 24 hours after surgery.   If you are taking public transportation, you will need to have a responsible individual with you.  Please call the Pre-admissions Testing Dept. at [phone] if you have any questions about these instructions.  Surgery Visitation Policy:  Patients having surgery or a procedure may have two visitors.  Children under the age of 63 must have an adult with them who is not the patient.  Inpatient Visitation:    Visiting hours are 7 a.m. to 8 p.m. Up to four visitors are allowed at one time in a patient room. The visitors may rotate out with other people during the day.  One visitor age 62 or older may stay with the patient overnight and must be in the room by 8 p.m.    Pre-operative 4 CHG Bath Instructions   You can play a key role in reducing the risk of infection after surgery. Your skin needs to be as free of germs as possible. You can reduce the number of germs on your skin by washing with CHG (chlorhexidine gluconate) soap before surgery. CHG is an antiseptic soap that kills germs and continues to kill germs even after washing.   DO NOT use if you have an allergy to chlorhexidine/CHG or antibacterial soaps. If your skin becomes reddened or irritated, stop using the CHG and notify one of our RNs at  [phone].   Please shower with the CHG soap starting 4 days before surgery using the following schedule:     Please keep in mind the following:  DO NOT shave, including legs and underarms, starting the day of your first shower.   You may shave your face at any point before/day of surgery.  Place clean sheets on your bed the day you start using CHG soap. Use a clean washcloth (not used since being washed) for each shower. DO NOT sleep with pets once you start using the CHG.   CHG Shower Instructions:  If you choose to wash your hair and private area, wash first with your normal shampoo/soap.  After you use shampoo/soap, rinse your hair and body thoroughly to remove shampoo/soap residue.  Turn the water OFF and apply about 3 tablespoons (45 ml) of CHG soap to a CLEAN washcloth.  Apply CHG soap ONLY FROM YOUR NECK DOWN TO YOUR TOES (washing for 3-5 minutes)  DO NOT use CHG soap on face, private areas, open wounds,  or sores.  Pay special attention to the area where your surgery is being performed.  If you are having back surgery, having someone wash your back for you may be helpful. Wait 2 minutes after CHG soap is applied, then you may rinse off the CHG soap.  Pat dry with a clean towel  Put on clean clothes/pajamas   If you choose to wear lotion, please use ONLY the CHG-compatible lotions on the back of this paper.     Additional instructions for the day of surgery: DO NOT APPLY any lotions, deodorants, cologne, or perfumes.   Put on clean/comfortable clothes.  Brush your teeth.  Ask your nurse before applying any prescription medications to the skin.      CHG Compatible Lotions   Aveeno Moisturizing lotion  Cetaphil Moisturizing Cream  Cetaphil Moisturizing Lotion  Clairol Herbal Essence Moisturizing Lotion, Dry Skin  Clairol Herbal Essence Moisturizing Lotion, Extra Dry Skin  Clairol Herbal Essence Moisturizing Lotion, Normal Skin  Curel Age Defying Therapeutic  Moisturizing Lotion with Alpha Hydroxy  Curel Extreme Care Body Lotion  Curel Soothing Hands Moisturizing Hand Lotion  Curel Therapeutic Moisturizing Cream, Fragrance-Free  Curel Therapeutic Moisturizing Lotion, Fragrance-Free  Curel Therapeutic Moisturizing Lotion, Original Formula  Eucerin Daily Replenishing Lotion  Eucerin Dry Skin Therapy Plus Alpha Hydroxy Crme  Eucerin Dry Skin Therapy Plus Alpha Hydroxy Lotion  Eucerin Original Crme  Eucerin Original Lotion  Eucerin Plus Crme Eucerin Plus Lotion  Eucerin TriLipid Replenishing Lotion  Keri Anti-Bacterial Hand Lotion  Keri Deep Conditioning Original Lotion Dry Skin Formula Softly Scented  Keri Deep Conditioning Original Lotion, Fragrance Free Sensitive Skin Formula  Keri Lotion Fast Absorbing Fragrance Free Sensitive Skin Formula  Keri Lotion Fast Absorbing Softly Scented Dry Skin Formula  Keri Original Lotion  Keri Skin Renewal Lotion Keri Silky Smooth Lotion  Keri Silky Smooth Sensitive Skin Lotion  Nivea Body Creamy Conditioning Oil  Nivea Body Extra Enriched Lotion  Nivea Body Original Lotion  Nivea Body Sheer Moisturizing Lotion Nivea Crme  Nivea Skin Firming Lotion  NutraDerm 30 Skin Lotion  NutraDerm Skin Lotion  NutraDerm Therapeutic Skin Cream  NutraDerm Therapeutic Skin Lotion  ProShield Protective Hand Cream  Provon moisturizing lotion  How to Use an Incentive Spirometer An incentive spirometer is a tool that measures how well you are filling your lungs with each breath. Learning to take long, deep breaths using this tool can help you keep your lungs clear and active. This may help to reverse or lessen your chance of developing breathing (pulmonary) problems, especially infection. You may be asked to use a spirometer: After a surgery. If you have a lung problem or a history of smoking. After a long period of time when you have been unable to move or be active. If the spirometer includes an indicator to show  the highest number that you have reached, your health care provider or respiratory therapist will help you set a goal. Keep a log of your progress as told by your health care provider. What are the risks? Breathing too quickly may cause dizziness or cause you to pass out. Take your time so you do not get dizzy or light-headed. If you are in pain, you may need to take pain medicine before doing incentive spirometry. It is harder to take a deep breath if you are having pain. How to use your incentive spirometer  Sit up on the edge of your bed or on a chair. Hold the incentive spirometer so that it  is in an upright position. Before you use the spirometer, breathe out normally. Place the mouthpiece in your mouth. Make sure your lips are closed tightly around it. Breathe in slowly and as deeply as you can through your mouth, causing the piston or the ball to rise toward the top of the chamber. Hold your breath for 3-5 seconds, or for as long as possible. If the spirometer includes a coach indicator, use this to guide you in breathing. Slow down your breathing if the indicator goes above the marked areas. Remove the mouthpiece from your mouth and breathe out normally. The piston or ball will return to the bottom of the chamber. Rest for a few seconds, then repeat the steps 10 or more times. Take your time and take a few normal breaths between deep breaths so that you do not get dizzy or light-headed. Do this every 1-2 hours when you are awake. If the spirometer includes a goal marker to show the highest number you have reached (best effort), use this as a goal to work toward during each repetition. After each set of 10 deep breaths, cough a few times. This will help to make sure that your lungs are clear. If you have an incision on your chest or abdomen from surgery, place a pillow or a rolled-up towel firmly against the incision when you cough. This can help to reduce pain while taking deep breaths and  coughing. General tips When you are able to get out of bed: Walk around often. Continue to take deep breaths and cough in order to clear your lungs. Keep using the incentive spirometer until your health care provider says it is okay to stop using it. If you have been in the hospital, you may be told to keep using the spirometer at home. Contact a health care provider if: You are having difficulty using the spirometer. You have trouble using the spirometer as often as instructed. Your pain medicine is not giving enough relief for you to use the spirometer as told. You have a fever. Get help right away if: You develop shortness of breath. You develop a cough with bloody mucus from the lungs. You have fluid or blood coming from an incision site after you cough. Summary An incentive spirometer is a tool that can help you learn to take long, deep breaths to keep your lungs clear and active. You may be asked to use a spirometer after a surgery, if you have a lung problem or a history of smoking, or if you have been inactive for a long period of time. Use your incentive spirometer as instructed every 1-2 hours while you are awake. If you have an incision on your chest or abdomen, place a pillow or a rolled-up towel firmly against your incision when you cough. This will help to reduce pain. Get help right away if you have shortness of breath, you cough up bloody mucus, or blood comes from your incision when you cough. This information is not intended to replace advice given to you by your health care provider. Make sure you discuss any questions you have with your health care provider. Document Revised: 10/30/2022 Document Reviewed: 10/30/2022 Elsevier Patient Education  2024 [employer]    Preoperative Educational Videos for Total Hip, Knee and Shoulder Replacements  To better prepare for surgery, please view our videos that explain the physical activity and discharge planning required to  have the best surgical recovery at [facility].  [email or web address]  Questions?  Call [phone] or email [email or web address]        Community Resource Directory to address health-related social needs:  [email or web address]

## 2023-11-05 ENCOUNTER — Encounter
Admission: RE | Admit: 2023-11-05 | Discharge: 2023-11-05 | Disposition: A | Discharge status: Home / Self Care | Source: Ambulatory Visit | Attending: Orthopedic Surgery | Admitting: Orthopedic Surgery

## 2023-11-05 ENCOUNTER — Other Ambulatory Visit: Payer: Self-pay

## 2023-11-05 VITALS — BP 133/75 | HR 50 | Resp 14 | Ht 64.0 in | Wt 191.0 lb

## 2023-11-05 DIAGNOSIS — Z01818 Encounter for other preprocedural examination: Secondary | ICD-10-CM | POA: Diagnosis present

## 2023-11-05 DIAGNOSIS — Z01812 Encounter for preprocedural laboratory examination: Secondary | ICD-10-CM

## 2023-11-05 DIAGNOSIS — Z01810 Encounter for preprocedural cardiovascular examination: Secondary | ICD-10-CM | POA: Diagnosis not present

## 2023-11-05 HISTORY — DX: Bradycardia, unspecified: R00.1

## 2023-11-05 HISTORY — DX: Other adrenocortical insufficiency: E27.49

## 2023-11-05 HISTORY — DX: Disorder of pituitary gland, unspecified: E23.7

## 2023-11-05 HISTORY — DX: Benign neoplasm of connective and other soft tissue, unspecified: D21.9

## 2023-11-05 HISTORY — DX: Other specified disorders of binocular movement: H51.8

## 2023-11-05 HISTORY — DX: Benign neoplasm of pituitary gland: D35.2

## 2023-11-05 HISTORY — DX: Tachycardia, unspecified: R00.0

## 2023-11-05 HISTORY — DX: Diabetes insipidus: E23.2

## 2023-11-05 HISTORY — DX: Hypothyroidism, unspecified: E03.9

## 2023-11-05 HISTORY — DX: Atherosclerotic heart disease of native coronary artery without angina pectoris: I25.10

## 2023-11-05 HISTORY — DX: Hyperlipidemia, unspecified: E78.5

## 2023-11-05 LAB — COMPREHENSIVE METABOLIC PANEL WITH GFR
ALT: 12 U/L (ref 0–44)
AST: 20 U/L (ref 15–41)
Albumin: 4.2 g/dL (ref 3.5–5.0)
Alkaline Phosphatase: 68 U/L (ref 38–126)
Anion gap: 9 (ref 5–15)
BUN: 13 mg/dL (ref 6–20)
CO2: 27 mmol/L (ref 22–32)
Calcium: 8.8 mg/dL — ABNORMAL LOW (ref 8.9–10.3)
Chloride: 103 mmol/L (ref 98–111)
Creatinine, Ser: 0.76 mg/dL (ref 0.44–1.00)
GFR, Estimated: >60 mL/min (ref >60)
Glucose, Bld: 85 mg/dL (ref 70–99)
Potassium: 3.7 mmol/L (ref 3.5–5.1)
Sodium: 139 mmol/L (ref 135–145)
Total Bilirubin: 0.6 mg/dL (ref 0.0–1.2)
Total Protein: 8.3 g/dL — ABNORMAL HIGH (ref 6.5–8.1)

## 2023-11-05 LAB — URINALYSIS, ROUTINE W REFLEX MICROSCOPIC
Bilirubin Urine: NEGATIVE
Glucose, UA: NEGATIVE mg/dL
Hgb urine dipstick: NEGATIVE
Ketones, ur: NEGATIVE mg/dL
Nitrite: NEGATIVE
Protein, ur: NEGATIVE mg/dL
Specific Gravity, Urine: 1.017 (ref 1.005–1.030)
pH: 5 (ref 5.0–8.0)

## 2023-11-05 LAB — CBC WITH DIFFERENTIAL/PLATELET
Abs Immature Granulocytes: 0.03 K/uL (ref 0.00–0.07)
Basophils Absolute: 0.1 K/uL (ref 0.0–0.1)
Basophils Relative: 1 %
Eosinophils Absolute: 0.2 K/uL (ref 0.0–0.5)
Eosinophils Relative: 2 %
HCT: 38.3 % (ref 36.0–46.0)
Hemoglobin: 13.1 g/dL (ref 12.0–15.0)
Immature Granulocytes: 0 %
Lymphocytes Relative: 39 %
Lymphs Abs: 3.9 K/uL (ref 0.7–4.0)
MCH: 30.7 pg (ref 26.0–34.0)
MCHC: 34.2 g/dL (ref 30.0–36.0)
MCV: 89.7 fL (ref 80.0–100.0)
Monocytes Absolute: 0.6 K/uL (ref 0.1–1.0)
Monocytes Relative: 6 %
Neutro Abs: 5.1 K/uL (ref 1.7–7.7)
Neutrophils Relative %: 52 %
Platelets: 225 K/uL (ref 150–400)
RBC: 4.27 MIL/uL (ref 3.87–5.11)
RDW: 12.9 % (ref 11.5–15.5)
WBC: 9.8 K/uL (ref 4.0–10.5)
nRBC: 0 % (ref 0.0–0.2)

## 2023-11-05 LAB — SURGICAL PCR SCREEN
MRSA, PCR: NEGATIVE
Staphylococcus aureus: NEGATIVE

## 2023-11-05 LAB — TYPE AND SCREEN
ABO/RH(D): O NEG
Antibody Screen: NEGATIVE

## 2023-11-10 ENCOUNTER — Encounter: Payer: Self-pay | Admitting: Orthopedic Surgery

## 2023-11-10 NOTE — Progress Notes (Signed)
 Perioperative / Anesthesia Services  Pre-Admission Testing Clinical Review / Pre-Operative Anesthesia Consult  Date: 11/10/23  PATIENT DEMOGRAPHICS: Name: Frances Mckay DOB: [birth year] MRN:   [id number]  Note: Available PAT nursing documentation and vital signs have been reviewed. Clinical nursing staff has updated patient's PMH/PSHx, current medication list, and drug allergies/intolerances to ensure complete and comprehensive history available to assist care teams in MDM as it pertains to the aforementioned surgical procedure and anticipated anesthetic course. Extensive review of available clinical information personally performed. Nursing documentation reviewed. [place] PMH and PSHx updated with any diagnoses and/or procedures that I have knowledge of that may have been inadvertently omitted during her intake with the pre-admission testing department's nursing staff.  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 female who is submitted for pre-surgical anesthesia review and clearance prior to her undergoing the above procedure. Patient is a Current Smoker. Pertinent PMH includes: CAD, palpitations, tachycardia, aortic atherosclerosis, angina, HLD, hypothyroidism, diabetes insipidus, shortness of breath, vertigo, secondary adrenal insufficiency, OA, anxiety (on BZO).  Patient is followed by cardiology Philippe, MD). She was last seen in the cardiology  clinic on 06/23/2023; notes reviewed. At the time of her clinic visit, patient doing well overall from a cardiovascular perspective. Patient denied any chest pain, shortness of breath, PND, orthopnea, palpitations, significant peripheral edema, weakness, fatigue, vertiginous symptoms, or presyncope/syncope. Patient with a past medical history significant for cardiovascular diagnoses. Documented physical exam was grossly benign, providing no evidence of acute exacerbation and/or decompensation of the patient's known cardiovascular conditions.  Coronary CTA was performed on 05/26/2022 that demonstrated an Agatston coronary artery calcium score of 52.9. This placed patient in the 93rd percentile for age, sex, and race matched controls. Calcium depositions noted to be isolated mainly in the LAD (44.5), LCx (7.48), and RCA (0.92) distributions.  Study demonstrated normal coronary origin with RIGHT dominance.  Most recent TTE performed on 06/04/2022 revealed a normal left ventricular systolic function with an EF of Khan 55%. There was no significant LVH.  There were no regional wall motion abnormalities.  Left ventricular diastolic Doppler parameters were normal.  GLS -19.8% (normal range <-18%). Right ventricular size and function normal.  RVSP = 27.3 mmHg.  There was mild mitral, mild tricuspid, and trivial pulmonic valve regurgitation.  All transvalvular gradients were noted to be normal providing no evidence of hemodynamically significant valvular stenosis. Aorta normal in size with no evidence of ectasia or aneurysmal dilatation.  Blood pressure reasonably controlled at 138/88 mmHg on currently prescribed beta-blocker (metoprolol tartrate) monotherapy.  Patient is on rosuvastatin for her HLD diagnosis and ASCVD prevention.  Patient has a diabetes insipidus diagnosis.  She does not have diabetes mellitus; most recent hemoglobin A1c was 5.9% when checked on 10/05/2023. She does not have an OSAH diagnosis. Patient  is able to complete all of her  ADL/IADLs without cardiovascular limitation.  Per the DASI, patient is  able to achieve at least 4 METS of physical activity without experiencing any significant degree of angina/anginal equivalent symptoms. No changes were made to her medication regimen during her visit with cardiology.  Patient scheduled to follow-up with outpatient cardiology in 12 months or sooner if needed.  Frances Mckay is scheduled for an elective ARTHROPLASTY, HIP, TOTAL, ANTERIOR APPROACH (Right: Hip) on 11/17/2023 with Dr. Arthea Sheer, MD. Given patient's past medical history significant for cardiovascular diagnoses, presurgical cardiac clearance was sought by the PAT team. Per cardiology, this patient is optimized for surgery and may proceed with the planned procedural course with a LOW risk of significant perioperative cardiovascular complications.  In review of the patient's medication reconciliation, it is noted that she is on daily oral antithrombotic therapy. Given that patient's past medical history is significant for cardiovascular diagnoses, including but not limited to CAD, orthopedics has cleared patient to continue her daily low dose ASA throughout her perioperative course.  Patient has been updated on these directives from her specialty care providers by the PAT team.  Patient denies previous perioperative complications with anesthesia in the past. In review her EMR, it is noted that patient underwent a general anesthetic course at [facility] in 07/2016 without documented complications.   MOST RECENT VITAL SIGNS:    11/05/2023    9:51 AM 05/12/2022    8:38 AM 05/12/2022    8:33 AM  Vitals with BMI  Height 5' 4  5' 4  Weight 191 lbs  175 lbs  BMI 32.77  30.02  Systolic 133 151   Diastolic 75 91   Pulse 50 56    PROVIDERS/SPECIALISTS: NOTE: Primary physician provider listed below. Patient may have been seen by APP or  partner within same practice.   PROVIDER ROLE / SPECIALTY LAST SHERLEAN Sheer Arthea, MD Orthopedics (Surgeon) 11/03/2023  Jeffie Cheryl BRAVO, MD Primary Care Provider [phone]  Florencio Shine, MD Cardiology 06/23/2023   ALLERGIES: No Known Allergies  CURRENT HOME MEDICATIONS: No current facility-administered medications for this encounter.    aspirin EC 81 MG tablet   diazepam (VALIUM) 5 MG tablet   diphenhydrAMINE (BENADRYL) 25 MG tablet   diphenhydramine-acetaminophen (TYLENOL PM) 25-500 MG TABS tablet   ibuprofen (ADVIL) 200 MG tablet   metoprolol tartrate (LOPRESSOR) 25 MG tablet   PARoxetine (PAXIL) 30 MG tablet   rosuvastatin (CRESTOR) 5 MG tablet   Docusate Calcium (STOOL SOFTENER PO)   HISTORY: Past Medical History:  Diagnosis Date   Anginal pain    Anxiety    a.) on SSRI (paroxetine) + BZO (diazepam) PRN   Aortic atherosclerosis    Coronary artery disease    Diabetes insipidus    DVD (dissociated vertical deviation)    Granular cell tumor    Hyperlipidemia    Hypothyroidism    Long-term use of aspirin therapy    Osteoarthritis    Palpitations    Pituitary microadenoma with hyperprolactinemia (HCC)    a.) s/p excision 12/18/2014   Secondary adrenal insufficiency    SOB (shortness of breath)    Strabismus    Tachycardia    Vertigo    Past Surgical History:  Procedure Laterality Date   ANKLE FRACTURE SURGERY Right    CESAREAN SECTION     COLONOSCOPY     EYE SURGERY     multiple eye surgeries as a child leading into adulthood   PARTIAL HYSTERECTOMY N/A 2012   Procedure: PARTIAL HYSTERECTOMY; Location: [facility]  PITUITARY MICROADENOMA RESECTION N/A 12/18/2014   Procedure: PITUITARY MICROADENOMA RESECTION; Location: [facility]   Family History  Problem Relation Age of Onset   Hypertension Mother    Diabetes Father    Hypertension Father    Hypertension Sister    Diabetes Maternal Grandmother    Hypertension Maternal Grandmother    Diabetes Paternal  Grandmother    Hypertension Paternal Grandmother    Lung cancer Paternal Grandfather    Skin cancer Maternal Grandfather    Breast cancer Neg Hx    Social History   Tobacco Use   Smoking status: Every Day    Types: Cigarettes   Smokeless tobacco: Never  Substance Use Topics   Alcohol use: No   LABS:  Hospital Outpatient Visit on 11/05/2023  Component Date Value Ref Range Status   WBC 11/05/2023 9.8  4.0 - 10.5 K/uL Final   RBC 11/05/2023 4.27  3.87 - 5.11 MIL/uL Final   Hemoglobin 11/05/2023 13.1  12.0 - 15.0 g/dL Final   HCT 89/68/7974 38.3  36.0 - 46.0 % Final   MCV 11/05/2023 89.7  80.0 - 100.0 fL Final   MCH 11/05/2023 30.7  26.0 - 34.0 pg Final   MCHC 11/05/2023 34.2  30.0 - 36.0 g/dL Final   RDW 89/68/7974 12.9  11.5 - 15.5 % Final   Platelets 11/05/2023 225  150 - 400 K/uL Final   nRBC 11/05/2023 0.0  0.0 - 0.2 % Final   Neutrophils Relative % 11/05/2023 52  % Final   Neutro Abs 11/05/2023 5.1  1.7 - 7.7 K/uL Final   Lymphocytes Relative 11/05/2023 39  % Final   Lymphs Abs 11/05/2023 3.9  0.7 - 4.0 K/uL Final   Monocytes Relative 11/05/2023 6  % Final   Monocytes Absolute 11/05/2023 0.6  0.1 - 1.0 K/uL Final   Eosinophils Relative 11/05/2023 2  % Final   Eosinophils Absolute 11/05/2023 0.2  0.0 - 0.5 K/uL Final   Basophils Relative 11/05/2023 1  % Final   Basophils Absolute 11/05/2023 0.1  0.0 - 0.1 K/uL Final   Immature Granulocytes 11/05/2023 0  % Final   Abs Immature Granulocytes 11/05/2023 0.03  0.00 - 0.07 K/uL Final   Performed at [facility], [address] [place], [place] [address]   Sodium 11/05/2023 139  135 - 145 mmol/L Final   Potassium 11/05/2023 3.7  3.5 - 5.1 mmol/L Final   Chloride 11/05/2023 103  98 - 111 mmol/L Final   CO2 11/05/2023 27  22 - 32 mmol/L Final   Glucose, Bld 11/05/2023 85  70 - 99 mg/dL Final   Glucose reference range applies only to samples taken after fasting for at least 8 hours.   BUN 11/05/2023 13  6 - 20 mg/dL  Final   Creatinine, Ser 11/05/2023 0.76  0.44 - 1.00 mg/dL Final   Calcium 89/68/7974 8.8 (L)  8.9 - 10.3 mg/dL Final   Total Protein 89/68/7974 8.3 (H)  6.5 - 8.1 g/dL Final   Albumin 89/68/7974 4.2  3.5 - 5.0 g/dL Final   AST 89/68/7974 20  15 - 41 U/L Final   ALT 11/05/2023 12  0 - 44 U/L Final   Alkaline Phosphatase 11/05/2023 68  38 - 126 U/L Final   Total Bilirubin 11/05/2023 0.6  0.0 - 1.2 mg/dL Final   GFR, Estimated 11/05/2023 >60  >60 mL/min Final   Comment: (NOTE) Calculated using the CKD-EPI Creatinine Equation (2021)    Anion gap 11/05/2023 9  5 -  15 Final   Performed at [facility], [address] [place], [place] [address]   Color, Urine 11/05/2023 YELLOW (A)  YELLOW Final   APPearance 11/05/2023 HAZY (A)  CLEAR Final   Specific Gravity, Urine 11/05/2023 1.017  1.005 - 1.030 Final   pH 11/05/2023 5.0  5.0 - 8.0 Final   Glucose, UA 11/05/2023 NEGATIVE  NEGATIVE mg/dL Final   Hgb urine dipstick 11/05/2023 NEGATIVE  NEGATIVE Final   Bilirubin Urine 11/05/2023 NEGATIVE  NEGATIVE Final   Ketones, ur 11/05/2023 NEGATIVE  NEGATIVE mg/dL Final   Protein, ur 89/68/7974 NEGATIVE  NEGATIVE mg/dL Final   Nitrite 89/68/7974 NEGATIVE  NEGATIVE Final   Leukocytes,Ua 11/05/2023 SMALL (A)  NEGATIVE Final   RBC / HPF 11/05/2023 0-5  0 - 5 RBC/hpf Final   WBC, UA 11/05/2023 6-10  0 - 5 WBC/hpf Final   Bacteria, UA 11/05/2023 RARE (A)  NONE SEEN Final   Squamous Epithelial / HPF 11/05/2023 0-5  0 - 5 /HPF Final   Mucus 11/05/2023 PRESENT   Final   Non Squamous Epithelial 11/05/2023 PRESENT (A)  NONE SEEN Final   Performed at [facility], [address] [place], [place] [address]   ABO/RH(D) 11/05/2023 O NEG   Final   Antibody Screen 11/05/2023 NEG   Final   Sample Expiration 11/05/2023 11/19/2023,2359   Final   Extend sample reason 11/05/2023    Final                   Value:NO TRANSFUSIONS OR PREGNANCY IN THE PAST 3 MONTHS Performed at [facility],  [address] [place], [place] [address]    MRSA, PCR 11/05/2023 NEGATIVE  NEGATIVE Final   Staphylococcus aureus 11/05/2023 NEGATIVE  NEGATIVE Final   Comment: (NOTE) The Xpert SA Assay (FDA approved for NASAL specimens in patients 25 years of age and older), is one component of a comprehensive surveillance program. It is not intended to diagnose infection nor to guide or monitor treatment. Performed at [facility], [address] [place], [place] [address]     ECG: Date: 11/05/2023  Time ECG obtained: 0932 AM Rate: 47 bpm Rhythm: sinus bradycardia Axis (leads I and aVF): normal Intervals: PR 140 ms. QRS 86 ms. QTc 412 ms. ST segment and T wave changes: No evidence of acute T wave abnormalities or significant ST segment elevation or depression.  Evidence of a possible, age undetermined, prior infarct:  No Comparison: Similar to previous tracing obtained on 05/12/2022   IMAGING / PROCEDURES: CT PELVIS WO CONTRAST performed on 10/12/2023 Progressive degenerative changes of the right hip joint.  No acute fracture or dislocation is seen.  US  ABDOMEN LIMITED RUQ (LIVER/GB) performed on 09/17/2023 No acute abnormality noted.   TRANSTHORACIC ECHOCARDIOGRAM performed on 06/04/2022 Normal left ventricular systolic function with an EF of >55% No regional wall motion abnormalities Diastolic Doppler parameters were normal Normal right ventricular size and function RVSP = 27.3 mmHg Normal gradients; no valvular stenosis No pericardial effusion  CT CARDIAC SCORING performed on 05/26/2022 Coronary calcium score of 52.9. This was 93rd percentile for age and sex matched control. CAC 1-99 in LAD, LCx, RCA. CAC-DRS A1/N3. Consider statin due to percentile > 75. Continue heart healthy lifestyle and risk factor modification.  IMPRESSION AND PLAN: Frances Mckay has been referred for pre-anesthesia review and clearance prior to her undergoing the planned anesthetic and  procedural courses. Available labs, pertinent testing, and imaging results were personally reviewed by me  in preparation for upcoming operative/procedural course. [facility] medical record has been updated following extensive record review and patient interview with PAT staff.   This patient has been appropriately cleared by cardiology with an overall LOW risk of patient experiencing significant perioperative cardiovascular complications. Based on clinical review performed today (11/10/23), barring any significant acute changes in the patient's overall condition, it is anticipated that she will be able to proceed with the planned surgical intervention. Any acute changes in clinical condition may necessitate her procedure being postponed and/or cancelled. Patient will meet with anesthesia team (MD and/or CRNA) on the day of her procedure for preoperative evaluation/assessment. Questions regarding anesthetic course will be fielded at that time.   Pre-surgical instructions were reviewed with the patient during his PAT appointment, and questions were fielded to satisfaction by PAT clinical staff. She has been instructed on which medications that she will need to hold prior to surgery, as well as the ones that have been deemed safe/appropriate to take on the day of her procedure. As part of the general education provided by PAT, patient made aware both verbally and in writing, that she would need to abstain from the use of any illegal substances during her perioperative course. She was advised that failure to follow the provided instructions could necessitate case cancellation or result in serious perioperative complications up to and including death. Patient encouraged to contact PAT and/or her surgeon's office to discuss any questions or concerns that may arise prior to surgery; verbalized understanding.   Dorise Pereyra, MSN, APRN, FNP-C, [facility]  Perioperative Services Nurse  Practitioner Phone: [phone] Fax: [phone] 11/10/23 1:33 PM  NOTE: This note has been prepared using Dragon dictation software. Despite my best ability to proofread, there is always the potential that unintentional transcriptional errors may still occur from this process.

## 2023-11-17 ENCOUNTER — Ambulatory Visit: Payer: Self-pay | Admitting: Urgent Care

## 2023-11-17 ENCOUNTER — Ambulatory Visit

## 2023-11-17 ENCOUNTER — Encounter
Admission: RE | Disposition: A | Discharge status: Home Health | Payer: Self-pay | Source: Home / Self Care | Attending: Orthopedic Surgery

## 2023-11-17 ENCOUNTER — Other Ambulatory Visit: Payer: Self-pay

## 2023-11-17 ENCOUNTER — Ambulatory Visit
Admission: RE | Admit: 2023-11-17 | Discharge: 2023-11-18 | Disposition: A | Discharge status: Home Health | Attending: Orthopedic Surgery | Admitting: Orthopedic Surgery

## 2023-11-17 ENCOUNTER — Encounter: Payer: Self-pay | Admitting: Orthopedic Surgery

## 2023-11-17 DIAGNOSIS — M1631 Unilateral osteoarthritis resulting from hip dysplasia, right hip: Secondary | ICD-10-CM | POA: Insufficient documentation

## 2023-11-17 DIAGNOSIS — E232 Diabetes insipidus: Secondary | ICD-10-CM | POA: Insufficient documentation

## 2023-11-17 DIAGNOSIS — E2749 Other adrenocortical insufficiency: Secondary | ICD-10-CM | POA: Diagnosis not present

## 2023-11-17 DIAGNOSIS — I25119 Atherosclerotic heart disease of native coronary artery with unspecified angina pectoris: Secondary | ICD-10-CM | POA: Insufficient documentation

## 2023-11-17 DIAGNOSIS — M1611 Unilateral primary osteoarthritis, right hip: Secondary | ICD-10-CM | POA: Diagnosis present

## 2023-11-17 DIAGNOSIS — F17210 Nicotine dependence, cigarettes, uncomplicated: Secondary | ICD-10-CM | POA: Diagnosis not present

## 2023-11-17 DIAGNOSIS — F419 Anxiety disorder, unspecified: Secondary | ICD-10-CM | POA: Insufficient documentation

## 2023-11-17 DIAGNOSIS — Z7982 Long term (current) use of aspirin: Secondary | ICD-10-CM | POA: Diagnosis not present

## 2023-11-17 DIAGNOSIS — Z96641 Presence of right artificial hip joint: Secondary | ICD-10-CM | POA: Diagnosis present

## 2023-11-17 DIAGNOSIS — I700 Atherosclerosis of aorta: Secondary | ICD-10-CM | POA: Insufficient documentation

## 2023-11-17 HISTORY — DX: Shortness of breath: R06.02

## 2023-11-17 HISTORY — DX: Unspecified osteoarthritis, unspecified site: M19.90

## 2023-11-17 HISTORY — DX: Angina pectoris, unspecified: I20.9

## 2023-11-17 HISTORY — DX: Palpitations: R00.2

## 2023-11-17 HISTORY — DX: Unspecified strabismus: H50.9

## 2023-11-17 HISTORY — DX: Long term (current) use of aspirin: Z79.82

## 2023-11-17 HISTORY — DX: Dizziness and giddiness: R42

## 2023-11-17 HISTORY — PX: TOTAL HIP ARTHROPLASTY: SHX124

## 2023-11-17 HISTORY — DX: Atherosclerosis of aorta: I70.0

## 2023-11-17 LAB — ABO/RH: ABO/RH(D): O NEG

## 2023-11-17 SURGERY — ARTHROPLASTY, HIP, TOTAL, ANTERIOR APPROACH
Anesthesia: General | Site: Hip | Laterality: Right

## 2023-11-17 MED ORDER — CEFAZOLIN SODIUM-DEXTROSE 2-4 GM/100ML-% IV SOLN
2.0000 g | INTRAVENOUS | Status: AC
  Administered 2023-11-17: 2 g via INTRAVENOUS

## 2023-11-17 MED ORDER — FENTANYL CITRATE (PF) 100 MCG/2ML IJ SOLN
INTRAMUSCULAR | Status: AC
  Filled 2023-11-17: qty 2

## 2023-11-17 MED ORDER — LACTATED RINGERS IV SOLN: INTRAVENOUS | Status: DC

## 2023-11-17 MED ORDER — SURGIPHOR WOUND IRRIGATION SYSTEM - OPTIME: TOPICAL | Status: DC | PRN

## 2023-11-17 MED ORDER — CHLORHEXIDINE GLUCONATE 0.12 % MT SOLN: 15.0000 mL | Freq: Once | OROMUCOSAL | Status: AC

## 2023-11-17 MED ORDER — SODIUM CHLORIDE (PF) 0.9 % IJ SOLN
INTRAMUSCULAR | Status: AC
  Filled 2023-11-17: qty 10

## 2023-11-17 MED ORDER — MORPHINE SULFATE (PF) 2 MG/ML IV SOLN: 0.5000 mg | INTRAVENOUS | Status: DC | PRN

## 2023-11-17 MED ORDER — ACETAMINOPHEN 500 MG PO TABS
1000.0000 mg | ORAL_TABLET | Freq: Three times a day (TID) | ORAL | 0 refills | Status: AC
  Filled 2023-11-17: qty 30, 5d supply, fill #0

## 2023-11-17 MED ORDER — ENOXAPARIN SODIUM 40 MG/0.4ML IJ SOSY
40.0000 mg | PREFILLED_SYRINGE | INTRAMUSCULAR | 0 refills | Status: AC
  Filled 2023-11-17: qty 5.6, 14d supply, fill #0

## 2023-11-17 MED ORDER — BUPIVACAINE-EPINEPHRINE (PF) 0.25% -1:200000 IJ SOLN
INTRAMUSCULAR | Status: AC
  Filled 2023-11-17: qty 30

## 2023-11-17 MED ORDER — CEFAZOLIN SODIUM-DEXTROSE 2-4 GM/100ML-% IV SOLN
2.0000 g | Freq: Four times a day (QID) | INTRAVENOUS | Status: AC
  Administered 2023-11-17 (×2): 2 g via INTRAVENOUS
  Filled 2023-11-17 (×4): qty 100

## 2023-11-17 MED ORDER — DEXMEDETOMIDINE HCL IN NACL 80 MCG/20ML IV SOLN
INTRAVENOUS | Status: DC | PRN
  Administered 2023-11-17: 4 ug via INTRAVENOUS

## 2023-11-17 MED ORDER — OXYCODONE HCL 5 MG/5ML PO SOLN: 5.0000 mg | Freq: Once | ORAL | Status: DC | PRN

## 2023-11-17 MED ORDER — MIDAZOLAM HCL 5 MG/5ML IJ SOLN
INTRAMUSCULAR | Status: DC | PRN
  Administered 2023-11-17: 2 mg via INTRAVENOUS

## 2023-11-17 MED ORDER — PROPOFOL 1000 MG/100ML IV EMUL
INTRAVENOUS | Status: AC
  Filled 2023-11-17: qty 100

## 2023-11-17 MED ORDER — BUPIVACAINE HCL (PF) 0.5 % IJ SOLN
INTRAMUSCULAR | Status: AC
  Filled 2023-11-17: qty 10

## 2023-11-17 MED ORDER — ORAL CARE MOUTH RINSE
15.0000 mL | Freq: Once | OROMUCOSAL | Status: AC
  Administered 2023-11-17: 15 mL via OROMUCOSAL

## 2023-11-17 MED ORDER — TRANEXAMIC ACID-NACL 1000-0.7 MG/100ML-% IV SOLN
INTRAVENOUS | Status: AC
  Filled 2023-11-17: qty 100

## 2023-11-17 MED ORDER — ONDANSETRON HCL 4 MG PO TABS
4.0000 mg | ORAL_TABLET | Freq: Four times a day (QID) | ORAL | 0 refills | Status: AC | PRN
  Filled 2023-11-17: qty 18, 21d supply, fill #0

## 2023-11-17 MED ORDER — TRANEXAMIC ACID-NACL 1000-0.7 MG/100ML-% IV SOLN
1000.0000 mg | INTRAVENOUS | Status: AC
  Administered 2023-11-17 (×2): 1000 mg via INTRAVENOUS

## 2023-11-17 MED ORDER — TRAMADOL HCL 50 MG PO TABS
50.0000 mg | ORAL_TABLET | Freq: Four times a day (QID) | ORAL | Status: DC | PRN
  Administered 2023-11-17 – 2023-11-18 (×3): 50 mg via ORAL
  Filled 2023-11-17 (×3): qty 1

## 2023-11-17 MED ORDER — KETOROLAC TROMETHAMINE 15 MG/ML IJ SOLN
15.0000 mg | Freq: Four times a day (QID) | INTRAMUSCULAR | Status: AC
  Administered 2023-11-17 – 2023-11-18 (×4): 15 mg via INTRAVENOUS
  Filled 2023-11-17 (×4): qty 1

## 2023-11-17 MED ORDER — PHENYLEPHRINE HCL-NACL 20-0.9 MG/250ML-% IV SOLN
INTRAVENOUS | Status: AC
  Filled 2023-11-17: qty 250

## 2023-11-17 MED ORDER — ASPIRIN 81 MG PO TBEC
81.0000 mg | DELAYED_RELEASE_TABLET | Freq: Every day | ORAL | Status: DC
  Administered 2023-11-18: 81 mg via ORAL
  Filled 2023-11-17: qty 1

## 2023-11-17 MED ORDER — CEFAZOLIN SODIUM-DEXTROSE 2-4 GM/100ML-% IV SOLN
INTRAVENOUS | Status: AC
  Filled 2023-11-17: qty 100

## 2023-11-17 MED ORDER — PROPOFOL 500 MG/50ML IV EMUL
INTRAVENOUS | Status: DC | PRN
  Administered 2023-11-17: 100 ug/kg/min via INTRAVENOUS

## 2023-11-17 MED ORDER — SODIUM CHLORIDE 0.9 % IR SOLN
Status: DC | PRN
  Administered 2023-11-17: 100 mL

## 2023-11-17 MED ORDER — PHENYLEPHRINE 80 MCG/ML (10ML) SYRINGE FOR IV PUSH (FOR BLOOD PRESSURE SUPPORT)
PREFILLED_SYRINGE | INTRAVENOUS | Status: DC | PRN
  Administered 2023-11-17: 160 ug via INTRAVENOUS
  Administered 2023-11-17 (×3): 80 ug via INTRAVENOUS

## 2023-11-17 MED ORDER — FENTANYL CITRATE (PF) 100 MCG/2ML IJ SOLN: 25.0000 ug | INTRAMUSCULAR | Status: DC | PRN

## 2023-11-17 MED ORDER — ONDANSETRON HCL 4 MG PO TABS: 4.0000 mg | ORAL_TABLET | Freq: Four times a day (QID) | ORAL | Status: DC | PRN

## 2023-11-17 MED ORDER — DOCUSATE SODIUM 100 MG PO CAPS
100.0000 mg | ORAL_CAPSULE | Freq: Two times a day (BID) | ORAL | 0 refills | Status: AC
  Filled 2023-11-17: qty 10, 5d supply, fill #0

## 2023-11-17 MED ORDER — OXYCODONE HCL 5 MG PO TABS
5.0000 mg | ORAL_TABLET | Freq: Four times a day (QID) | ORAL | 0 refills | Status: AC | PRN
  Filled 2023-11-17: qty 20, 5d supply, fill #0

## 2023-11-17 MED ORDER — MENTHOL 3 MG MT LOZG: 1.0000 | LOZENGE | OROMUCOSAL | Status: DC | PRN

## 2023-11-17 MED ORDER — DEXAMETHASONE SOD PHOSPHATE PF 10 MG/ML IJ SOLN
8.0000 mg | Freq: Once | INTRAMUSCULAR | Status: AC
  Administered 2023-11-17: 10 mg via INTRAVENOUS

## 2023-11-17 MED ORDER — CELECOXIB 200 MG PO CAPS
200.0000 mg | ORAL_CAPSULE | Freq: Two times a day (BID) | ORAL | 0 refills | Status: AC
  Filled 2023-11-17: qty 28, 14d supply, fill #0

## 2023-11-17 MED ORDER — ACETAMINOPHEN 500 MG PO TABS
1000.0000 mg | ORAL_TABLET | Freq: Three times a day (TID) | ORAL | Status: DC
  Administered 2023-11-18: 1000 mg via ORAL
  Filled 2023-11-17: qty 2

## 2023-11-17 MED ORDER — ENOXAPARIN SODIUM 40 MG/0.4ML IJ SOSY
40.0000 mg | PREFILLED_SYRINGE | INTRAMUSCULAR | Status: DC
  Administered 2023-11-18: 40 mg via SUBCUTANEOUS
  Filled 2023-11-17: qty 0.4

## 2023-11-17 MED ORDER — 0.9 % SODIUM CHLORIDE (POUR BTL) OPTIME
TOPICAL | Status: DC | PRN
  Administered 2023-11-17: 500 mL

## 2023-11-17 MED ORDER — PHENYLEPHRINE HCL-NACL 20-0.9 MG/250ML-% IV SOLN
INTRAVENOUS | Status: DC | PRN
  Administered 2023-11-17: 30 ug/min via INTRAVENOUS

## 2023-11-17 MED ORDER — BUPIVACAINE LIPOSOME 1.3 % IJ SUSP
INTRAMUSCULAR | Status: AC
Start: 2023-11-17 — End: 2023-11-17
  Filled 2023-11-17: qty 20

## 2023-11-17 MED ORDER — PROPOFOL 10 MG/ML IV BOLUS
INTRAVENOUS | Status: DC | PRN
  Administered 2023-11-17: 30 mg via INTRAVENOUS
  Administered 2023-11-17: 50 mg via INTRAVENOUS

## 2023-11-17 MED ORDER — FENTANYL CITRATE (PF) 100 MCG/2ML IJ SOLN
INTRAMUSCULAR | Status: DC | PRN
  Administered 2023-11-17 (×2): 50 ug via INTRAVENOUS

## 2023-11-17 MED ORDER — METOPROLOL TARTRATE 25 MG PO TABS
25.0000 mg | ORAL_TABLET | Freq: Two times a day (BID) | ORAL | Status: DC
  Administered 2023-11-18: 25 mg via ORAL
  Filled 2023-11-17 (×2): qty 1

## 2023-11-17 MED ORDER — ACETAMINOPHEN 10 MG/ML IV SOLN
INTRAVENOUS | Status: AC
  Filled 2023-11-17: qty 100

## 2023-11-17 MED ORDER — MIDAZOLAM HCL 2 MG/2ML IJ SOLN
INTRAMUSCULAR | Status: AC
  Filled 2023-11-17: qty 2

## 2023-11-17 MED ORDER — ONDANSETRON HCL 4 MG/2ML IJ SOLN
INTRAMUSCULAR | Status: AC
Start: 2023-11-17 — End: 2023-11-17
  Filled 2023-11-17: qty 2

## 2023-11-17 MED ORDER — PHENOL 1.4 % MT LIQD: 1.0000 | OROMUCOSAL | Status: DC | PRN

## 2023-11-17 MED ORDER — LIDOCAINE HCL (CARDIAC) PF 100 MG/5ML IV SOSY
PREFILLED_SYRINGE | INTRAVENOUS | Status: DC | PRN
  Administered 2023-11-17: 100 mg via INTRAVENOUS

## 2023-11-17 MED ORDER — ROSUVASTATIN CALCIUM 5 MG PO TABS
5.0000 mg | ORAL_TABLET | ORAL | Status: DC
  Administered 2023-11-17 – 2023-11-18 (×2): 5 mg via ORAL
  Filled 2023-11-17 (×2): qty 1

## 2023-11-17 MED ORDER — ONDANSETRON HCL 4 MG/2ML IJ SOLN: 4.0000 mg | Freq: Four times a day (QID) | INTRAMUSCULAR | Status: DC | PRN

## 2023-11-17 MED ORDER — HYDROCODONE-ACETAMINOPHEN 5-325 MG PO TABS
1.0000 | ORAL_TABLET | ORAL | Status: DC | PRN
  Administered 2023-11-17: 2 via ORAL
  Filled 2023-11-17 (×2): qty 2

## 2023-11-17 MED ORDER — METOCLOPRAMIDE HCL 10 MG PO TABS: 5.0000 mg | ORAL_TABLET | Freq: Three times a day (TID) | ORAL | Status: DC | PRN

## 2023-11-17 MED ORDER — CHLORHEXIDINE GLUCONATE 0.12 % MT SOLN
OROMUCOSAL | Status: AC
  Filled 2023-11-17: qty 15

## 2023-11-17 MED ORDER — LIDOCAINE HCL (PF) 2 % IJ SOLN
INTRAMUSCULAR | Status: AC
  Filled 2023-11-17: qty 5

## 2023-11-17 MED ORDER — PAROXETINE HCL 20 MG PO TABS
30.0000 mg | ORAL_TABLET | ORAL | Status: DC
  Administered 2023-11-18: 30 mg via ORAL
  Filled 2023-11-17: qty 1.5

## 2023-11-17 MED ORDER — BUPIVACAINE HCL (PF) 0.5 % IJ SOLN
INTRAMUSCULAR | Status: DC | PRN
Start: 2023-11-17 — End: 2023-11-17
  Administered 2023-11-17: 2.5 mL

## 2023-11-17 MED ORDER — ACETAMINOPHEN 10 MG/ML IV SOLN
INTRAVENOUS | Status: DC | PRN
  Administered 2023-11-17: 1000 mg via INTRAVENOUS

## 2023-11-17 MED ORDER — TRAMADOL HCL 50 MG PO TABS
50.0000 mg | ORAL_TABLET | Freq: Four times a day (QID) | ORAL | 0 refills | Status: AC | PRN
  Filled 2023-11-17: qty 30, 7d supply, fill #0

## 2023-11-17 MED ORDER — SODIUM CHLORIDE 0.9 % IV SOLN: INTRAVENOUS | Status: DC

## 2023-11-17 MED ORDER — METOCLOPRAMIDE HCL 5 MG/ML IJ SOLN: 5.0000 mg | Freq: Three times a day (TID) | INTRAMUSCULAR | Status: DC | PRN

## 2023-11-17 MED ORDER — PROPOFOL 10 MG/ML IV BOLUS
INTRAVENOUS | Status: AC
  Filled 2023-11-17: qty 20

## 2023-11-17 MED ORDER — SODIUM CHLORIDE (PF) 0.9 % IJ SOLN
INTRAMUSCULAR | Status: DC | PRN
  Administered 2023-11-17: 50 mL

## 2023-11-17 MED ORDER — EPHEDRINE SULFATE-NACL 50-0.9 MG/10ML-% IV SOSY
PREFILLED_SYRINGE | INTRAVENOUS | Status: DC | PRN
Start: 2023-11-17 — End: 2023-11-17
  Administered 2023-11-17: 10 mg via INTRAVENOUS
  Administered 2023-11-17: 5 mg via INTRAVENOUS
  Administered 2023-11-17: 10 mg via INTRAVENOUS

## 2023-11-17 MED ORDER — ONDANSETRON HCL 4 MG/2ML IJ SOLN
INTRAMUSCULAR | Status: DC | PRN
Start: 2023-11-17 — End: 2023-11-17
  Administered 2023-11-17: 4 mg via INTRAVENOUS

## 2023-11-17 MED ORDER — OXYCODONE HCL 5 MG PO TABS: 5.0000 mg | ORAL_TABLET | Freq: Once | ORAL | Status: DC | PRN

## 2023-11-17 MED ORDER — ACETAMINOPHEN 325 MG PO TABS: 325.0000 mg | ORAL_TABLET | Freq: Four times a day (QID) | ORAL | Status: DC | PRN

## 2023-11-17 MED ORDER — PANTOPRAZOLE SODIUM 40 MG PO TBEC
40.0000 mg | DELAYED_RELEASE_TABLET | Freq: Every day | ORAL | Status: DC
  Administered 2023-11-17 – 2023-11-18 (×2): 40 mg via ORAL
  Filled 2023-11-17 (×2): qty 1

## 2023-11-17 MED ORDER — GLYCOPYRROLATE 0.2 MG/ML IJ SOLN
INTRAMUSCULAR | Status: DC | PRN
  Administered 2023-11-17 (×2): .2 mg via INTRAVENOUS

## 2023-11-17 MED ORDER — DOCUSATE SODIUM 100 MG PO CAPS
100.0000 mg | ORAL_CAPSULE | Freq: Two times a day (BID) | ORAL | Status: DC
  Administered 2023-11-17 – 2023-11-18 (×3): 100 mg via ORAL
  Filled 2023-11-17 (×3): qty 1

## 2023-11-17 SURGICAL SUPPLY — 57 items
BLADE CLIPPER SURG (BLADE) IMPLANT
BLADE SAGITTAL AGGR TOOTH XLG (BLADE) ×1 IMPLANT
BNDG COHESIVE 6X5 TAN ST LF (GAUZE/BANDAGES/DRESSINGS) ×2 IMPLANT
BRUSH SCRUB EZ PLAIN DRY (MISCELLANEOUS) ×1 IMPLANT
CHLORAPREP W/TINT 26 (MISCELLANEOUS) ×1 IMPLANT
DERMABOND ADVANCED .7 DNX12 (GAUZE/BANDAGES/DRESSINGS) ×1 IMPLANT
DRAPE C-ARM XRAY 36X54 (DRAPES) ×1 IMPLANT
DRAPE SHEET LG 3/4 BI-LAMINATE (DRAPES) ×2 IMPLANT
DRAPE TABLE BACK 80X90 (DRAPES) ×1 IMPLANT
DRSG MEPILEX SACRM 8.7X9.8 (GAUZE/BANDAGES/DRESSINGS) ×1 IMPLANT
DRSG OPSITE POSTOP 4X8 (GAUZE/BANDAGES/DRESSINGS) ×1 IMPLANT
ELECTRODE BLDE 4.0 EZ CLN MEGD (MISCELLANEOUS) ×1 IMPLANT
ELECTRODE REM PT RTRN 9FT ADLT (ELECTROSURGICAL) ×1 IMPLANT
GLOVE BIO SURGEON STRL SZ8 (GLOVE) ×1 IMPLANT
GLOVE BIOGEL PI IND STRL 8 (GLOVE) ×1 IMPLANT
GLOVE PI ORTHO PRO STRL 7.5 (GLOVE) ×2 IMPLANT
GLOVE PI ORTHO PRO STRL SZ8 (GLOVE) ×2 IMPLANT
GLOVE SURG SYN 7.5 PF PI (GLOVE) ×1 IMPLANT
GOWN SRG XL LONG LVL 3 NONREIN (GOWNS) ×1 IMPLANT
GOWN SRG XL LVL 3 NONREINFORCE (GOWNS) ×1 IMPLANT
GOWN STRL REUS W/ TWL LRG LVL3 (GOWN DISPOSABLE) ×1 IMPLANT
HEAD BIOLOX HIP 36/-2.5 (Joint) IMPLANT
HOOD PEEL AWAY T7 (MISCELLANEOUS) ×2 IMPLANT
INSERT TRIDENT POLY 36 0DEG (Insert) IMPLANT
IV NS 100ML SINGLE PACK (IV SOLUTION) ×1 IMPLANT
KIT PATIENT CARE HANA TABLE (KITS) ×1 IMPLANT
KIT TURNOVER CYSTO (KITS) ×1 IMPLANT
LIGHT WAVEGUIDE WIDE FLAT (MISCELLANEOUS) ×1 IMPLANT
MANIFOLD NEPTUNE II (INSTRUMENTS) ×1 IMPLANT
MARKER SKIN DUAL TIP RULER LAB (MISCELLANEOUS) ×1 IMPLANT
MAT ABSORB FLUID 56X50 GRAY (MISCELLANEOUS) ×1 IMPLANT
NDL SPNL 20GX3.5 QUINCKE YW (NEEDLE) ×1 IMPLANT
NEEDLE SPNL 20GX3.5 QUINCKE YW (NEEDLE) ×1 IMPLANT
NS IRRIG 500ML POUR BTL (IV SOLUTION) ×1 IMPLANT
PACK HIP COMPR (MISCELLANEOUS) ×1 IMPLANT
PAD ARMBOARD POSITIONER FOAM (MISCELLANEOUS) ×1 IMPLANT
PENCIL SMOKE EVACUATOR (MISCELLANEOUS) ×1 IMPLANT
SCREW HEX LP 6.5X15 (Screw) IMPLANT
SCREW HEX LP 6.5X20 (Screw) IMPLANT
SHELL CLUSTERHOLE ACETABULAR 5 (Shell) IMPLANT
SLEEVE SCD COMPRESS KNEE MED (STOCKING) ×1 IMPLANT
SOLN STERILE WATER BTL 1000 ML (IV SOLUTION) ×1 IMPLANT
SOLUTION IRRIG SURGIPHOR (IV SOLUTION) ×1 IMPLANT
STEM STD OFFSET SZ3 32.5 (Stem) IMPLANT
SURGIFLO W/THROMBIN 8M KIT (HEMOSTASIS) IMPLANT
SUT BONE WAX W31G (SUTURE) ×1 IMPLANT
SUT ETHIBOND 2 V 37 (SUTURE) ×1 IMPLANT
SUT SILK 0 30XBRD TIE 6 (SUTURE) ×1 IMPLANT
SUT STRATAFIX 14 PDO 36 VLT (SUTURE) ×1 IMPLANT
SUT VIC AB 0 CT1 36 (SUTURE) ×1 IMPLANT
SUT VIC AB 2-0 CT2 27 (SUTURE) ×1 IMPLANT
SUTURE STRATA SPIR 4-0 18 (SUTURE) ×1 IMPLANT
SYR 20ML LL LF (SYRINGE) ×2 IMPLANT
TAPE MICROFOAM 4IN (TAPE) IMPLANT
TOWEL OR 17X26 4PK STRL BLUE (TOWEL DISPOSABLE) IMPLANT
TRAP FLUID SMOKE EVACUATOR (MISCELLANEOUS) ×1 IMPLANT
WAND WEREWOLF FASTSEAL 6.0 (MISCELLANEOUS) ×1 IMPLANT

## 2023-11-17 NOTE — Transfer of Care (Signed)
 Immediate Anesthesia Transfer of Care Note  Patient: Frances Mckay  Procedure(s) Performed: ARTHROPLASTY, HIP, TOTAL, ANTERIOR APPROACH (Right: Hip)  Patient Location: PACU  Anesthesia Type:MAC and Spinal  Level of Consciousness: awake and drowsy  Airway & Oxygen Therapy: Patient Spontanous Breathing and Patient connected to face mask oxygen  Post-op Assessment: Report given to RN and Post -op Vital signs reviewed and stable  Post vital signs: Reviewed and stable  Last Vitals:  Vitals Value Taken Time  BP 77/52 11/17/23 09:50  Temp    Pulse 65 11/17/23 09:53  Resp 14 11/17/23 09:53  SpO2 98 % 11/17/23 09:53  Vitals shown include unfiled device data.  Last Pain:  Vitals:   11/17/23 0617  TempSrc: Tympanic         Complications: No notable events documented.

## 2023-11-17 NOTE — H&P (Signed)
 History of Present Illness: Frances Mckay is an 58 y.o. female presents today for history and physical for right anterior total hip arthroplasty with Dr. Lorelle on [place] 12/2023. She has x-rays showing complete loss of joint space in the right hip with severe degenerative changes. She has had 10 years of increasing pain with no relief with NSAIDs. She has had a hip joint injection in the past with only 2 weeks worth of relief. Her pain is located in the right groin, anterior thigh and is 10 out of 10. Pain interferes with her quality of life and activities daily living.  Patient is a non-smoker nondiabetic with a BMI of 33.8  Past Medical History: Past Medical History:  Diagnosis Date  Allergic rhinitis  Allergy  Anxiety  Hyperlipidemia  Pituitary microadenoma (CMS/HHS-HCC)  s/p resection at [facility] on 12/18/14   Past Surgical History: Past Surgical History:  Procedure Laterality Date  EXCISION PITUITARY TUMOR TRANSNASAL/TRANSSEPTAL 12/18/2014  Done at [facility]. nonfunction pit microadenoma s/p MIPS, right nasal septal flap on 12/18/14. Had Doyle splints in place. Did have high flow intraop CSF leak.  COLONOSCOPY 07/24/2019  (4) Tubular adenoma of the colon/Repeat 75yrs/[facility] (07/20/2022 Recall letter returned.awb)  Colon @ [facility] 06/04/2023  Tubular adenomas/Repeat 52yrs/[facility]  CESAREAN SECTION  [facility] OPERATIVE REPORT  FRACTURE SURGERY  Ankle  HYSTERECTOMY  ORIF RIGHT TRIMALLEOLAR ANKLE FRACTURE-DISLOCATION   Past Family History: Family History  Problem Relation Age of Onset  Myocardial Infarction (Heart attack) Father  High blood pressure (Hypertension) Father  Diabetes Father  Colon polyps Father  Skin cancer Father  COPD Father  Diabetes type II Father  Heart disease Father  Hyperlipidemia (Elevated cholesterol) Father  Lung cancer Father  Sleep apnea Father  High blood pressure (Hypertension) Mother  Hyperlipidemia (Elevated cholesterol) Mother  Colon cancer Maternal  Grandfather  Gout Maternal Grandfather  Diabetes type II Maternal Grandmother  Heart disease Maternal Grandmother  High blood pressure (Hypertension) Maternal Grandmother  Lung cancer Paternal Grandfather  Diabetes type II Paternal Grandmother  High blood pressure (Hypertension) Paternal Grandmother  Stroke Paternal Grandmother  Stroke Paternal Aunt  Sleep apnea Sister   Medications: Current Outpatient Medications  Medication Sig Dispense Refill  acetaminophen (TYLENOL) 500 mg capsule Take 1,000 mg by mouth every 4 (four) hours as needed for Fever. Extra strength  aspirin 81 MG EC tablet TAKE 1 TABLET BY MOUTH EVERY DAY 90 tablet 3  diazePAM (VALIUM) 5 MG tablet TAKE 1 TABLET(5 MG) BY MOUTH EVERY 12 HOURS AS NEEDED FOR ANXIETY 30 tablet 0  metoprolol TARTrate (LOPRESSOR) 25 MG tablet TAKE 1 TABLET(25 MG) BY MOUTH TWICE DAILY 180 tablet 3  PARoxetine (PAXIL) 30 MG tablet TAKE 1 TABLET BY MOUTH EVERY DAY 135 tablet 3  rosuvastatin (CRESTOR) 5 MG tablet Take 1 tablet (5 mg total) by mouth once daily 90 tablet 3   No current facility-administered medications for this visit.   Allergies: No Known Allergies   Visit Vitals: Vitals:  11/03/23 1401  BP: 118/80    Review of Systems:  A comprehensive 14 point ROS was performed, reviewed, and the pertinent orthopaedic findings are documented in the HPI.  Physical Exam: Body mass index is 33.44 kg/m. General:  Well developed, well nourished, no apparent distress, normal affect, normal gait with mildly antalgic gait with no assistive vices  HEENT: Head normocephalic, atraumatic, PERRL.   Abdomen: Soft, non tender, non distended, Bowel sounds present.  Heart: Examination of the heart reveals regular, rate, and rhythm. There is no murmur  noted on ascultation. There is a normal apical pulse.  Lungs: Lungs are clear to auscultation. There is no wheeze, rhonchi, or crackles. There is normal expansion of bilateral chest walls.    Right hip exam  SKIN: intact SWELLING: none WARMTH: no warmth TENDERNESS: none, Stinchfield Positive ROM: 0 degrees internal rotation and 30 degrees external rotation and pain with internal rotation,; Hip Flexion 80 STRENGTH: limited by pain GAIT: antalgic with a limp favoring the right STABILITY: stable to testing CREPITUS: yes LEG LENGTH DISCREPANCY: left longer by .5 cm NEUROLOGICAL EXAM: normal VASCULAR EXAM: normal LUMBAR SPINE: tenderness: no straight leg raising sign: no motor exam: normal  The contralateral hip was examined for comparison and it showed: TENDERNESS: none ROM: normal and full STRENGTH: normal STABILITY: stable to testing  Hip Imaging :  Right hip x-rays reviewed by me today show l severe degenerative changes with dysplastic remodeling of the right acetabulum. There is bone-on-bone articulation with lateralization of the hip center superior and lateral erosion of the acetabular dome. There is femoral head flattening and cystic changes as well as sclerosis. The right leg is significantly shorter than the left on x-ray. The left hip shows mild degenerative changes with some medial and inferior joint space narrowing. No fractures or dislocations noted about either hip or the pelvis.   Assessment:  Encounter Diagnosis  Name Primary?  Primary osteoarthritis of right hip Yes   Plan: Frances Mckay is a 58 year old female here today for history and physical for right anterior total hip arthroplasty with Dr. Lorelle 11/17/2023. She has had greater than 10 years of right groin and thigh pain that has been progressively increasing in interfere with her quality of life and activities day living. X-rays show severe degenerative changes of the right hip joint with complete loss of superior acetabular joint space. Risks, benefits, complications of a right anterior total hip arthroplasty have been discussed with the patient. Patient has agreed and consented procedure with Dr.  Lorelle on 11/17/2023.  The hospitalization and post-operative care and rehabilitation were also discussed. The use of perioperative antibiotics and DVT prophylaxis were discussed. The risk, benefits and alternatives to a surgical intervention were discussed at length with the patient. The patient was also advised of risks related to the medical comorbidities and elevated body mass index (BMI). A lengthy discussion took place to review the most common complications including but not limited to: deep vein thrombosis, pulmonary embolus, heart attack, stroke, infection, wound breakdown, heterotopic ossification, dislocation, numbness, leg length in-equality, intraoperative fracture, damage to nerves, tendon,muscles, arteries or other blood vessels, death and other possible complications from anesthesia. The patient was told that we will take steps to minimize these risks by using sterile technique, antibiotics and DVT prophylaxis when appropriate and follow the patient postoperatively in the office setting to monitor progress. The possibility of recurrent pain, no improvement in pain and actual worsening of pain were also discussed with the patient. The risk of dislocation following total hip replacement was discussed and potential precautions to prevent dislocation were reviewed.    Reviewed the use of implants and the surgical plan. Discussed plan to lengthen her leg and the risk of persistent leg length inequality. All questions answered patient agrees with plan for right anterior total hip arthroplasty.

## 2023-11-17 NOTE — Plan of Care (Signed)
   Problem: Pain Management: Goal: Pain level will decrease with appropriate interventions Outcome: Progressing

## 2023-11-17 NOTE — Interval H&P Note (Signed)
 Patient history and physical updated. Consent reviewed including risks, benefits, and alternatives to surgery. Patient agrees with above plan to proceed with right anterior total hip arthroplasty.

## 2023-11-17 NOTE — Discharge Instructions (Signed)

## 2023-11-17 NOTE — Evaluation (Addendum)
 Physical Therapy Evaluation Patient Details Name: Frances Mckay MRN: [id number] DOB: August 30, 1965 Today's Date: 11/17/2023  History of Present Illness  admitted for acute hospitalization s/p R THA (direct anterior), WBAT (11/17/23)  Clinical Impression  Patient resting in bed upon arrival to session; supportive daughter present at bedside.  Patient alert and oriented, follows commands and agreeable to session.  Endorses good pain control, rating pain 1-2/10 throughout session. Demonstrates good post-op strength and ROM to R LE with isolated therex and functional activities, easily and fluidity moving R LE throughout functional range during session. Able to complete bed mobility with mod indep; sit/stand, basic transfers and gait (120') with RW, cga/close sup; up/down 4 steps with HHA/single rail, min assist.  Gait pattern significant for step to progressing to reciprocal stepping pattern with fair/good stance time and WBing R LE; fair cadence; mildly antalgic, guarded due to post-op soreness.  Anticipate good recovery of gait mechanics as rehab progresses, pain controlled. Would benefit from skilled PT to address above deficits and promote optimal return to PLOF.; recommend post-acute PT follow up as indicated by interdisciplinary care team.          If plan is discharge home, recommend the following: A little help with walking and/or transfers;A little help with bathing/dressing/bathroom   Can travel by private vehicle        Equipment Recommendations None recommended by PT  Recommendations for Other Services       Functional Status Assessment Patient has had a recent decline in their functional status and demonstrates the ability to make significant improvements in function in a reasonable and predictable amount of time.     Precautions / Restrictions Precautions Precautions: Anterior Hip;Fall Restrictions Weight Bearing Restrictions Per Provider Order: Yes RLE Weight Bearing  Per Provider Order: Weight bearing as tolerated      Mobility  Bed Mobility Overal bed mobility: Modified Independent                  Transfers Overall transfer level: Needs assistance Equipment used: Rolling walker (2 wheels) Transfers: Sit to/from Stand Sit to Stand: Contact guard assist                Ambulation/Gait Ambulation/Gait assistance: Contact guard assist Gait Distance (Feet): 120 Feet Assistive device: Rolling walker (2 wheels)         General Gait Details: step to progressing to reciprocal stepping pattern with fair/good stance time and WBing R LE; fair cadence; mildly antalgic, guarded due to post-op soreness.  Anticipate good recovery of gait mechanics as rehab progresses, pain controlled.  Stairs Stairs: Yes Stairs assistance: Min assist, Contact guard assist   Number of Stairs:  (4x2) General stair comments: up/down 4 with bilat rails, cga/close sup-self selecting mix of step to and reciprocal stepping pattern; up/down 4 with HHA/single rail, min assist-transitioning to step to for optimal safety and stability.  Do recommend use of SPC and +1 assist when negotiating stairs at daughter's home  Wheelchair Mobility     Tilt Bed    Modified Rankin (Stroke Patients Only)       Balance Overall balance assessment: Needs assistance Sitting-balance support: No upper extremity supported, Feet supported Sitting balance-Leahy Scale: Good     Standing balance support: Bilateral upper extremity supported Standing balance-Leahy Scale: Fair                               Pertinent Vitals/Pain Pain Assessment Pain  Assessment: Faces Faces Pain Scale: Hurts a little bit Pain Location: r hip Pain Descriptors / Indicators: Aching Pain Intervention(s): Limited activity within patient's tolerance, Monitored during session, Premedicated before session, Repositioned    Home Living Family/patient expects to be discharged to:: Private  residence Living Arrangements: Spouse/significant other Available Help at Discharge: Family Type of Home: House Home Access: Stairs to enter   Secretary/administrator of Steps: daughter's home: 3 steps, no rails; patient's home: 5-6 steps, bilat rails (close enough)   Home Layout: One level Home Equipment: Agricultural Consultant (2 wheels);Cane - single point;Tub bench;Toilet riser (leg lifter)      Prior Function Prior Level of Function : Independent/Modified Independent             Mobility Comments: Indep with ADLs, household and community mobilization; working full time (recently on light duty, 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  Denies fall history.       Extremity/Trunk Assessment        Lower Extremity Assessment RLE Deficits / Details: R hip grossly 4-/5, knee and ankle at least 4+/5       Communication   Communication Communication: No apparent difficulties    Cognition Arousal: Alert Behavior During Therapy: WFL for tasks assessed/performed   PT - Cognitive impairments: No apparent impairments                         Following commands: Intact       Cueing Cueing Techniques: Verbal cues     General Comments      Exercises Other Exercises Other Exercises: Issued handout with written/pictorial descriptions of therex; patient voiced understanding.   Assessment/Plan    PT Assessment Patient needs continued PT services  PT Problem List Decreased range of motion;Decreased activity tolerance;Decreased strength;Decreased mobility;Decreased knowledge of use of DME;Decreased safety awareness;Decreased knowledge of precautions       PT Treatment Interventions DME instruction;Gait training;Stair training;Functional mobility training;Therapeutic activities;Therapeutic exercise;Patient/family education    PT Goals (Current goals can be found in the Care Plan section)  Acute Rehab PT Goals Patient Stated Goal: to go home PT  Goal Formulation: With patient Time For Goal Achievement: 12/01/23 Potential to Achieve Goals: Good    Frequency BID     Co-evaluation               AM-PAC PT 6 Clicks Mobility  Outcome Measure Help needed turning from your back to your side while in a flat bed without using bedrails?: None Help needed moving from lying on your back to sitting on the side of a flat bed without using bedrails?: None Help needed moving to and from a bed to a chair (including a wheelchair)?: A Little Help needed standing up from a chair using your arms (e.g., wheelchair or bedside chair)?: A Little Help needed to walk in hospital room?: A Little Help needed climbing 3-5 steps with a railing? : A Little 6 Click Score: 20    End of Session Equipment Utilized During Treatment: Gait belt Activity Tolerance: Patient tolerated treatment well Patient left: in chair;with call bell/phone within reach;with family/visitor present Nurse Communication: Mobility status PT Visit Diagnosis: Muscle weakness (generalized) (M62.81);Difficulty in walking, not elsewhere classified (R26.2)    Time: 1417-1440 PT Time Calculation (min) (ACUTE ONLY): 23 min   Charges:   PT Evaluation $PT Eval Moderate Complexity: 1 Mod   PT General Charges $$ ACUTE PT VISIT: 1 Visit  Lige Lakeman H. Delores, PT, DPT, NCS 11/17/23, 10:17 PM [phone]

## 2023-11-17 NOTE — Discharge Summary (Signed)
 Physician Discharge Summary  Patient ID: Frances Mckay MRN: [id number] DOB/AGE: 09/11/1965 58 y.o.  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) on SSRI (paroxetine) + BZO (diazepam) PRN   Aortic atherosclerosis    Coronary artery disease    Diabetes insipidus    DVD (dissociated vertical deviation)    Granular cell tumor    Hyperlipidemia    Hypothyroidism    Long-term use of aspirin therapy    Osteoarthritis    Palpitations    Pituitary microadenoma with hyperprolactinemia (HCC)    a.) s/p excision 12/18/2014   Secondary adrenal insufficiency    SOB (shortness of breath)    Strabismus    Tachycardia    Vertigo      Transfusion: none   Consultants (if any):   Discharged Condition: Improved  Hospital Course: Frances Mckay is an 58 y.o. female who was admitted 11/17/2023 with a diagnosis of S/P total right hip arthroplasty and went to the  operating room on 11/17/2023 and underwent the above named procedures.    Surgeries: Procedure(s): ARTHROPLASTY, HIP, TOTAL, ANTERIOR APPROACH on 11/17/2023 Patient tolerated the surgery well. Taken to PACU where she was stabilized and then transferred to the orthopedic floor.  Started on Lovenox 40 mg q 24 hrs. TEDs and SCDs applied bilaterally. Heels elevated on bed. No evidence of DVT. Negative Homan. Physical therapy started on day #1 for gait training and transfer. OT started day #1 for ADL and assisted devices.  Patient's IV was d/c on day #1. Patient was able to safely and independently complete all PT goals. PT recommending discharge to home.    On post op day #1 patient was stable and ready for discharge to home with HHPT.  Implants:  Cup: Trident Tritanium clusterhole 52/E w/x3 screws    Liner: Neutral X3 poly 36/E  Stem: Insignia #3 std offset  Head:Biolox ceramic 36 -2.84mm    She was given perioperative antibiotics:  Anti-infectives (From admission, onward)    Start     Dose/Rate Route Frequency Ordered Stop   11/17/23 1330  ceFAZolin (ANCEF) IVPB 2g/100 mL premix        2 g 200 mL/hr over 30 Minutes Intravenous Every 6 hours 11/17/23 1016 11/17/23 2104   11/17/23 0615  ceFAZolin (ANCEF) IVPB 2g/100 mL premix        2 g 200 mL/hr over 30 Minutes Intravenous On call to O.R. 11/17/23 [id number] 11/17/23 0755     .  She was given sequential compression devices, early ambulation, and lovenox TEDs for DVT prophylaxis.  She benefited maximally from the hospital stay and there were no complications.    Recent vital signs:  Vitals:   11/18/23 0501 11/18/23 0741  BP: 113/69 118/65  Pulse: 65 65  Resp: 16 15  Temp: (!) 97.2 F (36.2 C) 98.4 F (36.9 C)  SpO2: 96% 95%    Recent laboratory studies:  Lab Results  Component Value Date   HGB 9.8 (L) 11/18/2023   HGB 13.1 11/05/2023   HGB 13.4 01/29/2012   Lab Results  Component Value Date   WBC 10.3 11/18/2023   PLT 150  11/18/2023   Lab Results  Component Value Date   INR 1.0 01/29/2012   Lab Results  Component Value Date   NA 140 11/18/2023   K 3.8 11/18/2023   CL 104 11/18/2023   CO2 28 11/18/2023   BUN 15 11/18/2023   CREATININE 0.69 11/18/2023   GLUCOSE 122 (H) 11/18/2023    Discharge Medications:   Allergies as of 11/18/2023   No Known Allergies      Medication List     STOP taking these medications    ibuprofen 200 MG tablet Commonly known as: ADVIL       TAKE these medications    Acetaminophen Extra Strength 500 MG Tabs Take 2 tablets (1,000 mg total) by mouth every 8 (eight) hours.   aspirin EC 81 MG tablet Take 81 mg by mouth daily. Swallow whole.   celecoxib 200 MG capsule Commonly known as: CeleBREX Take 1 capsule (200 mg total) by mouth 2 (two) times daily for 14 days.   diazepam 5 MG tablet Commonly known as: VALIUM Take 5 mg by mouth every 12 (twelve) hours as needed for anxiety.   diphenhydrAMINE 25 MG tablet Commonly known as: BENADRYL Take 25 mg by mouth daily as needed for allergies.   diphenhydramine-acetaminophen 25-500 MG Tabs tablet Commonly known as: TYLENOL PM Take 2 tablets by mouth at bedtime as needed (sleep).   docusate sodium 100 MG capsule Commonly known as: COLACE Take 1 capsule (100 mg total) by mouth 2 (two) times daily.   enoxaparin 40 MG/0.4ML injection Commonly known as: LOVENOX Inject 0.4 mLs (40 mg total) into the skin daily for 14 days.   metoprolol tartrate 25 MG tablet Commonly known as: LOPRESSOR Take 25 mg by mouth 2 (two) times daily.   ondansetron 4 MG tablet Commonly known as: ZOFRAN Take 1 tablet (4 mg total) by mouth every 6 (six) hours as needed for nausea.   oxyCODONE 5 MG immediate release tablet Commonly known as: Roxicodone Take 1 tablet (5 mg total) by mouth every 6 (six) hours as needed for breakthrough pain.   PARoxetine 30 MG tablet Commonly known as: PAXIL Take 30 mg by mouth every morning.    rosuvastatin 5 MG tablet Commonly known as: CRESTOR Take 5 mg by mouth every morning.   STOOL SOFTENER PO Take 2 capsules by mouth as needed.   traMADol 50 MG tablet Commonly known as: ULTRAM Take 1 tablet (50 mg total) by mouth every 6 (six) hours as needed for moderate pain (pain score 4-6).               Durable Medical Equipment  (From admission, onward)           Start     Ordered   11/17/23 1635  For home use only DME 3 n 1  Once        11/17/23 1635   11/17/23 1635  For  home use only DME Walker rolling  Once       Question Answer Comment  Walker: With 5 Inch Wheels   Patient needs a walker to treat with the following condition S/P total right hip arthroplasty      11/17/23 1635            Diagnostic Studies: DG HIP UNILAT WITH PELVIS 2-3 VIEWS RIGHT Result Date: 11/17/2023 EXAM: 2 or 3 VIEW(S) XRAY OF THE PELVIS AND RIGHT HIP 11/17/2023 09:38:00 AM COMPARISON: None available. CLINICAL HISTORY: [id number] Surgery, elective [id number] Surgery, elective [phone] FINDINGS: BONES AND JOINTS: SI joints are symmetric. Right hip arthroplasty hardware is noted. The hardware appears normally aligned and well seated. No acute radiographic abnormality is appreciated about the right hip. The left hip demonstrates normal alignment. No acute fracture. Refer to the operative note for complete description of the surgical procedure. SOFT TISSUES: The soft tissues are unremarkable. IMPRESSION: 1. No acute abnormality of the right hip. 2. Right hip arthroplasty hardware appears normally aligned and well seated. Electronically signed by: Donnice Mania MD 11/17/2023 02:24 PM EST RP Workstation: [id number]   DG C-Arm 1-60 Min-No Report Result Date: 11/17/2023 Fluoroscopy was utilized by the requesting physician.  No radiographic interpretation.   DG C-Arm 1-60 Min-No Report Result Date: 11/17/2023 Fluoroscopy was utilized by the requesting physician.  No radiographic interpretation.     Disposition:      Follow-up Information     Charlene Debby BROCKS, PA-C Follow up in 2 week(s).   Specialties: Orthopedic Surgery, Emergency Medicine Contact information: [address] [place] [address] [phone]                  Signed: Debby BROCKS Charlene 11/18/2023, 9:06 AM

## 2023-11-17 NOTE — Anesthesia Procedure Notes (Signed)
 Spinal  Patient location during procedure: OR Start time: 11/17/2023 7:40 AM End time: 11/17/2023 7:49 AM Reason for block: surgical anesthesia Staffing Performed: resident/CRNA  Resident/CRNA: Trudy Rankin LABOR, CRNA Other anesthesia staff: Bernice Waddell POUR, RN Performed by: Trudy Rankin LABOR, CRNA Authorized by: Leavy Ned, MD   Preanesthetic Checklist Completed: patient identified, IV checked, site marked, risks and benefits discussed, surgical consent, monitors and equipment checked, pre-op evaluation and timeout performed Spinal Block Patient position: sitting Prep: ChloraPrep Patient monitoring: heart rate, continuous pulse ox, blood pressure and cardiac monitor Approach: midline Location: L3-4 Injection technique: single-shot Needle Needle type: Introducer and Pencan  Needle gauge: 24 G Needle length: 10 cm Needle insertion depth: 7 cm Assessment Sensory level: T4 Events: CSF return Additional Notes 1st attempt by SRNA unsuccessful. 2nd attempt by CRNA successful. Negative paresthesia. Negative blood return. Positive free-flowing CSF. Expiration date of kit checked and confirmed. Patient tolerated procedure well, without complications.

## 2023-11-17 NOTE — Progress Notes (Signed)
 Patient is not able to walk the distance required to go the bathroom, or she is unable to safely negotiate stairs required to access the bathroom.  A 3in1 BSC will alleviate this problem.       Lollie Marrow, PA-C [facility]

## 2023-11-17 NOTE — Op Note (Signed)
 Patient Name: Khara Renaud  FMW:[id number]  Pre-Operative Diagnosis: Right hip Osteoarthritis  Post-Operative Diagnosis: (same)  Procedure: Right Total Hip Arthroplasty  Components/Implants: Cup: Trident Tritanium clusterhole 52/E w/x3 screws    Liner: Neutral X3 poly 36/E  Stem: Insignia #3 std offset  Head:Biolox ceramic 36 -2.43mm  Date of Surgery: 11/17/2023  Surgeon: Arthea Sheer MD  Assistant: Annabella Gambler RFNA (present and scrubbed throughout the case, critical for assistance with exposure, retraction, instrumentation, and closure)   Anesthesiologist: Leavy  Anesthesia: Spinal  EBL: 150cc  IVF:700cc  Complications: None   Brief history: The patient is a 58 year old female with a history of osteoarthritis of the right hip with pain limiting their range of motion and activities of daily living, which has failed multiple attempts at conservative therapy.  The risks and benefits of total hip arthroplasty as definitive surgical treatment were discussed with the patient, who opted to proceed with the operation.  After outpatient medical clearance and optimization was completed the patient was admitted to [facility] for the procedure.  All preoperative films were reviewed and an appropriate surgical plan was made prior to surgery.   Description of procedure: The patient was brought to the operating room where laterality was confirmed by all those present to be the right side.  The patient was administered spinal anesthesia on a stretcher prior to being moved supine on the operating room table. Patient was given an intravenous dose of antibiotics for surgical prophylaxis and TXA.  All bony prominences and extremities were well padded and the patient was securely attached to the table boots, a perineal post was placed and the patient had a safety strap placed.  Surgical site was prepped with alcohol and chlorhexidine. The surgical site over the hip was and  draped in typical sterile fashion with multiple layers of adhesive and nonadhesive drapes.  The incision site was marked out with a sterile marker and care was taken to assess the position of the ASIS and ensure appropriate position for the incision.    A surgical timeout was then called with participation of all staff in the room the patient was then a confirmed again and laterality confirmed.  Incision was made over the anterior lateral aspect of the proximal thigh in line with the TFL.  Appropriate retractors were placed and all bleeding vessels were coagulated within the subcutaneous and fatty layers.  An incision was made in the TFL fascia in the interval was carefully identified.  The lateral ascending branches of the circumflex vessels were identified, cauterized and carefully dissected. The main vessels were then tied with a 0 silk hand tie.  Retractors were placed around the superior lateral and inferior medial aspects of the femoral neck and a capsulotomy was performed exposing the hip joint.  Retraction stitches were placed and the capsulotomy to assist with visualization.  Femoral neck cut was then made and the femoral head was extracted after placing the leg in traction.  Bone wax was then applied to the proximal cut surface of the femur and water cooled bipolar electrocautery was used to address any bleeding around the femoral neck cut.  Retractors were then placed around the acetabulum to fully visualize the joint space, and the remaining labral tissue was removed and pulvinar was removed.   The acetabulum was then sequentially reamed up to the appropriate size in order to get good fit and fill for the acetabular component while under fluoroscopic guidance.  Acetabular component was then placed and malleted into  a secure fit while confirming position and abduction angle and anteversion utilizing fluoroscopy.  3 screws were then placed in the acetabular cup to assist in securing the cup in place.  The cup was irrigated,  a real neutral liner was placed, impacted, and checked for stability. The femur traction was dropped and sequentially externally rotated while performing a release of the posterior and superomedial tissues off of the proximal femur to allow for mobility, care was taken to preserve the external rotators and piriformis attachments.  The remaining interval between the abductors and the capsule was dissected out and a retractor was placed over the superolateral aspect of the femur over the greater trochanter.  The leg was carefully brought down into extension and adducted to provide visualization of the proximal femur for broaching.  The femur was then sequentially broached up to an appropriate size which provided for good fill and stability to the femoral broach.  A trial neck and head were placed on the femoral broach and the leg was brought up for reduction.  The hip was reduced and manual check of stability was performed.  The hip was found to be stable in flexion internal rotation and extension external rotation.  Leg lengths were confirmed on fluoroscopy.   The hip was then dislocated the trial neck and head were removed.  The leg was then brought down into extension and adduction in the proximal femur was reexposed.  The broach trial was removed and the femur was irrigated with normal saline prior to the real femoral stem being implanted.  After the femoral stem was seated and shown to have good fit and fill the appropriate head was impacted the leg was brought up and reduced.  There was good range of motion with stability in flexion internal rotation and extension external rotation on testing.  Leg lengths were found to be appropriate on fluoroscopic evaluation at this time.  The hip was then irrigated with betdine based surgiphor solution and then saline solution.  The capsulotomy was repaired with Ethibond sutures.  A pericapsular and peritrochanteric cocktail with Exparel and  bupivacaine was then injected as well as the subcutaneous tissues. The fascia was closed with a #1 barbed running suture.  The deep tissues were closed with Vicryl sutures the subcutaneous tissues were closed with interrupted Vicryl sutures and a running barbed 4-0 suture.  The skin was then reinforced with Dermabond and a sterile dressing was placed.   The patient was awoken from anesthesia transferred off of the operating room table onto a hospital bed where examination of leg lengths found the leg lengths to be equal with a good distal pulse.  The patient was then transferred to the PACU in stable condition.

## 2023-11-17 NOTE — Anesthesia Preprocedure Evaluation (Signed)
 Anesthesia Evaluation  Patient identified by MRN, date of birth, ID band Patient awake    Reviewed: Allergy & Precautions, NPO status , Patient's Chart, lab work & pertinent test results  Airway Mallampati: III  TM Distance: >3 FB Neck ROM: full    Dental  (+) Chipped   Pulmonary neg pulmonary ROS, Current Smoker   Pulmonary exam normal        Cardiovascular + CAD  Normal cardiovascular exam     Neuro/Psych  PSYCHIATRIC DISORDERS Anxiety     negative neurological ROS     GI/Hepatic negative GI ROS, Neg liver ROS,,,  Endo/Other  Hypothyroidism    Renal/GU      Musculoskeletal   Abdominal   Peds  Hematology negative hematology ROS (+)   Anesthesia Other Findings Past Medical History: No date: Anginal pain No date: Anxiety     Comment:  a.) on SSRI (paroxetine) + BZO (diazepam) PRN No date: Aortic atherosclerosis No date: Coronary artery disease 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) No date: Granular cell tumor No date: Hyperlipidemia No date: Hypothyroidism No date: Long-term use of aspirin therapy No date: Osteoarthritis No date: Palpitations No date: Pituitary microadenoma with hyperprolactinemia (HCC)     Comment:  a.) s/p excision 12/18/2014 No date: Secondary adrenal insufficiency No date: SOB (shortness of breath) No date: Strabismus No date: Tachycardia No date: Vertigo  Past Surgical History: No date: ANKLE FRACTURE SURGERY; Right No date: CESAREAN SECTION No date: COLONOSCOPY No date: EYE SURGERY     Comment:  multiple eye surgeries as a child leading into adulthood 2012: PARTIAL HYSTERECTOMY; N/A     Comment:  Procedure: PARTIAL HYSTERECTOMY; Location: [facility] 12/18/2014: PITUITARY MICROADENOMA RESECTION; N/A     Comment:  Procedure: PITUITARY MICROADENOMA RESECTION; Location:               [facility]  BMI    Body Mass Index: 32.79 kg/m       Reproductive/Obstetrics negative OB ROS                              Anesthesia Physical Anesthesia Plan  ASA: 2  Anesthesia Plan: Spinal and General   Post-op Pain Management:    Induction: Intravenous  PONV Risk Score and Plan: 2 and Ondansetron, Dexamethasone, Propofol infusion, TIVA and Midazolam  Airway Management Planned: Natural Airway and Nasal Cannula  Additional Equipment:   Intra-op Plan:   Post-operative Plan:   Informed Consent: I have reviewed the patients History and Physical, chart, labs and discussed the procedure including the risks, benefits and alternatives for the proposed anesthesia with the patient or authorized representative who has indicated his/her understanding and acceptance.     Dental Advisory Given  Plan Discussed with: Anesthesiologist, CRNA and Surgeon  Anesthesia Plan Comments: (Patient reports no bleeding problems and no anticoagulant use.  Plan for spinal with backup GA  Patient consented for risks of anesthesia including but not limited to:  - adverse reactions to medications - damage to eyes, teeth, lips or other oral mucosa - nerve damage due to positioning  - risk of bleeding, infection and or nerve damage from spinal that could lead to paralysis - risk of headache or failed spinal - damage to teeth, lips or other oral mucosa - sore throat or hoarseness - damage to heart, brain, nerves, lungs, other parts of body or loss of life  Patient voiced understanding and assent.)  Anesthesia Quick Evaluation

## 2023-11-18 ENCOUNTER — Encounter: Payer: Self-pay | Admitting: Orthopedic Surgery

## 2023-11-18 DIAGNOSIS — M1611 Unilateral primary osteoarthritis, right hip: Secondary | ICD-10-CM | POA: Diagnosis not present

## 2023-11-18 LAB — BASIC METABOLIC PANEL WITH GFR
Anion gap: 8 (ref 5–15)
BUN: 15 mg/dL (ref 6–20)
CO2: 28 mmol/L (ref 22–32)
Calcium: 8.5 mg/dL — ABNORMAL LOW (ref 8.9–10.3)
Chloride: 104 mmol/L (ref 98–111)
Creatinine, Ser: 0.69 mg/dL (ref 0.44–1.00)
GFR, Estimated: >60 mL/min (ref >60)
Glucose, Bld: 122 mg/dL — ABNORMAL HIGH (ref 70–99)
Potassium: 3.8 mmol/L (ref 3.5–5.1)
Sodium: 140 mmol/L (ref 135–145)

## 2023-11-18 LAB — CBC
HCT: 28.5 % — ABNORMAL LOW (ref 36.0–46.0)
Hemoglobin: 9.8 g/dL — ABNORMAL LOW (ref 12.0–15.0)
MCH: 30.7 pg (ref 26.0–34.0)
MCHC: 34.4 g/dL (ref 30.0–36.0)
MCV: 89.3 fL (ref 80.0–100.0)
Platelets: 150 K/uL (ref 150–400)
RBC: 3.19 MIL/uL — ABNORMAL LOW (ref 3.87–5.11)
RDW: 12.6 % (ref 11.5–15.5)
WBC: 10.3 K/uL (ref 4.0–10.5)
nRBC: 0 % (ref 0.0–0.2)

## 2023-11-18 NOTE — Progress Notes (Signed)
 Physical Therapy Treatment Patient Details Name: Frances Mckay MRN: [id number] DOB: June 11, 1965 Today's Date: 11/18/2023   History of Present Illness admitted for acute hospitalization s/p R THA (direct anterior), WBAT (11/17/23)    PT Comments  Pt was long sitting in bed upon arrival. A and O x 4. Endorses no pain at rest or during session. Was pre-medicated prior. She demonstrated abilities to exit bed, stand, and ambulate without LOB or safety concerns. Performed stairs to simulate home entry. Pt is progressing well however will benefit form continued skilled PT at [facility] to maximize her independence and safety with all ADLs. HHPT recommended. Pt has all DME needs already.    If plan is discharge home, recommend the following: A little help with walking and/or transfers;A little help with bathing/dressing/bathroom     Equipment Recommendations  None recommended by PT       Precautions / Restrictions Precautions Precautions: Anterior Hip;Fall Precaution Booklet Issued: Yes (comment) Recall of Precautions/Restrictions: Intact Restrictions Weight Bearing Restrictions Per Provider Order: Yes RLE Weight Bearing Per Provider Order: Weight bearing as tolerated     Mobility  Bed Mobility Overal bed mobility: Modified Independent       Transfers Overall transfer level: Needs assistance Equipment used: Rolling walker (2 wheels) Transfers: Sit to/from Stand Sit to Stand: Supervision     Ambulation/Gait Ambulation/Gait assistance: Supervision Gait Distance (Feet): 200 Feet Assistive device: Rolling walker (2 wheels) Gait Pattern/deviations: Step-through pattern Gait velocity: decreased  General Gait Details: pt was easily able to ambulate 200 + feet with RW without LOB or safety concern. Encouraged heel strike to toe off sequecing   Stairs Stairs: Yes Stairs assistance: Contact guard assist Stair Management: Backwards, Step to pattern, With walker, No rails Number of Stairs:  3 General stair comments: Pt perfromed up/down stairs backwards without rails to simulate home entry. Both pt and pt's daughter feel safe in abilities to enter exit home.    Balance Overall balance assessment: Modified Independent    Standing balance support: Bilateral upper extremity supported Standing balance-Leahy Scale: Good       Communication Communication Communication: No apparent difficulties  Cognition Arousal: Alert Behavior During Therapy: WFL for tasks assessed/performed   PT - Cognitive impairments: No apparent impairments    PT - Cognition Comments: Pt was A and O x 4. endorses only stiffness/tightness, no formal pain Following commands: Intact      Cueing Cueing Techniques: Verbal cues         Pertinent Vitals/Pain Pain Assessment Pain Assessment: No/denies pain Faces Pain Scale: No hurt Pain Location: R hip Pain Descriptors / Indicators: Tightness Pain Intervention(s): Limited activity within patient's tolerance, Monitored during session, Premedicated before session, Repositioned, Ice applied     PT Goals (current goals can now be found in the care plan section) Acute Rehab PT Goals Patient Stated Goal: to go home Progress towards PT goals: Progressing toward goals    Frequency    BID       AM-PAC PT 6 Clicks Mobility   Outcome Measure  Help needed turning from your back to your side while in a flat bed without using bedrails?: None Help needed moving from lying on your back to sitting on the side of a flat bed without using bedrails?: None Help needed moving to and from a bed to a chair (including a wheelchair)?: None Help needed standing up from a chair using your arms (e.g., wheelchair or bedside chair)?: A Little Help needed to walk in hospital room?:  A Little Help needed climbing 3-5 steps with a railing? : A Little 6 Click Score: 21    End of Session Equipment Utilized During Treatment: Gait belt Activity Tolerance: Patient  tolerated treatment well Patient left: with call bell/phone within reach;in bed (pt seated EOB eating breakfast) Nurse Communication: Mobility status PT Visit Diagnosis: Muscle weakness (generalized) (M62.81);Difficulty in walking, not elsewhere classified (R26.2)     Time: 9240-9183 PT Time Calculation (min) (ACUTE ONLY): 17 min  Charges:    $Gait Training: 8-22 mins PT General Charges $$ ACUTE PT VISIT: 1 Visit                    Rankin Essex PTA 11/18/23, 8:29 AM

## 2023-11-18 NOTE — Plan of Care (Signed)

## 2023-11-18 NOTE — Progress Notes (Signed)
 DISCHARGE NOTE:    Pt dc with IV removed and dc instructions given. Pt has both TED hose on and in place. Pt received an ice pack and voices no questions or concerns at this. Pt wheeled down to medical mall entrance by staff. Pt's daughter provided transportation.

## 2023-11-18 NOTE — Progress Notes (Signed)
   Subjective: 1 Day Post-Op Procedure(s) (LRB): ARTHROPLASTY, HIP, TOTAL, ANTERIOR APPROACH (Right) Patient reports pain as mild.   Patient is well, and has had no acute complaints or problems Denies any CP, SOB, ABD pain. We will continue therapy today.  Plan is to go Home after hospital stay.  Objective: Vital signs in last 24 hours: Temp:  [96.8 F (36 C)-98.4 F (36.9 C)] 98.4 F (36.9 C) (11/13 0741) Pulse Rate:  [50-65] 65 (11/13 0741) Resp:  [10-18] 15 (11/13 0741) BP: (101-141)/(57-93) 118/65 (11/13 0741) SpO2:  [95 %-99 %] 95 % (11/13 0741)  Intake/Output from previous day: 11/12 0701 - 11/13 0700 In: 1251.7 [I.V.:1151.7; IV Piggyback:100] Out: 900 [Urine:750; Blood:150] Intake/Output this shift: No intake/output data recorded.  Recent Labs    11/18/23 0641  HGB 9.8*   Recent Labs    11/18/23 0641  WBC 10.3  RBC 3.19*  HCT 28.5*  PLT 150   Recent Labs    11/18/23 0641  NA 140  K 3.8  CL 104  CO2 28  BUN 15  CREATININE 0.69  GLUCOSE 122*  CALCIUM 8.5*   No results for input(s): LABPT, INR in the last 72 hours.  EXAM General - Patient is Alert, Appropriate, and Oriented Extremity - Neurovascular intact Sensation intact distally Intact pulses distally Dorsiflexion/Plantar flexion intact Dressing - dressing C/D/I and no drainage Motor Function - intact, moving foot and toes well on exam.   Past Medical History:  Diagnosis Date   Anginal pain    Anxiety    a.) on SSRI (paroxetine) + BZO (diazepam) PRN   Aortic atherosclerosis    Coronary artery disease    Diabetes insipidus    DVD (dissociated vertical deviation)    Granular cell tumor    Hyperlipidemia    Hypothyroidism    Long-term use of aspirin therapy    Osteoarthritis    Palpitations    Pituitary microadenoma with hyperprolactinemia (HCC)    a.) s/p excision 12/18/2014   Secondary adrenal insufficiency    SOB (shortness of breath)    Strabismus    Tachycardia     Vertigo     Assessment/Plan:   1 Day Post-Op Procedure(s) (LRB): ARTHROPLASTY, HIP, TOTAL, ANTERIOR APPROACH (Right) Principal Problem:   S/P total right hip arthroplasty  Estimated body mass index is 32.79 kg/m as calculated from the following:   Height as of this encounter: 5' 4 (1.626 m).   Weight as of this encounter: 86.6 kg. Advance diet Up with therapy Pain well controlled Labs and VSS CM to assist with discharge to home with HHPT Today  DVT Prophylaxis - Lovenox, TED hose, and SCDs Weight-Bearing as tolerated to right leg   T. Medford Amber, PA-C [facility] 11/18/2023, 9:05 AM

## 2023-11-18 NOTE — Plan of Care (Signed)
  Problem: Activity: Goal: Ability to avoid complications of mobility impairment will improve Outcome: Progressing Goal: Ability to tolerate increased activity will improve Outcome: Progressing   Problem: Pain Management: Goal: Pain level will decrease with appropriate interventions Outcome: Progressing   

## 2023-11-18 NOTE — Anesthesia Postprocedure Evaluation (Signed)
 Anesthesia Post Note  Patient: Frances Mckay  Procedure(s) Performed: ARTHROPLASTY, HIP, TOTAL, ANTERIOR APPROACH (Right: Hip)  Patient location during evaluation: Nursing Unit Anesthesia Type: Spinal Level of consciousness: awake and alert Pain management: pain level controlled Vital Signs Assessment: post-procedure vital signs reviewed and stable Respiratory status: spontaneous breathing, nonlabored ventilation, respiratory function stable and patient connected to nasal cannula oxygen Cardiovascular status: blood pressure returned to baseline and stable Postop Assessment: no apparent nausea or vomiting Anesthetic complications: no   No notable events documented.   Last Vitals:  Vitals:   11/18/23 0741 11/18/23 0924  BP: 118/65 117/69  Pulse: 65 (!) 57  Resp: 15   Temp: 36.9 C   SpO2: 95%     Last Pain:  Vitals:   11/18/23 0826  TempSrc:   PainSc: 2                  Debby Mines

## 2024-01-10 ENCOUNTER — Other Ambulatory Visit (HOSPITAL_COMMUNITY): Payer: Self-pay
